# Patient Record
Sex: Male | Born: 1985
Health system: Southern US, Community
[De-identification: ages and names within clinical notes are randomized; demographics above are authoritative.]

## PROBLEM LIST (undated history)

## (undated) DIAGNOSIS — F988 Other specified behavioral and emotional disorders with onset usually occurring in childhood and adolescence: Secondary | ICD-10-CM

## (undated) DIAGNOSIS — T7840XA Allergy, unspecified, initial encounter: Secondary | ICD-10-CM

## (undated) DIAGNOSIS — K219 Gastro-esophageal reflux disease without esophagitis: Secondary | ICD-10-CM

## (undated) HISTORY — PX: HERNIA REPAIR: SHX51

## (undated) HISTORY — PX: UPPER GASTROINTESTINAL ENDOSCOPY: SHX188

## (undated) HISTORY — DX: Gastro-esophageal reflux disease without esophagitis: K21.9

## (undated) HISTORY — DX: Allergy, unspecified, initial encounter: T78.40XA

---
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
## 2012-04-18 ENCOUNTER — Encounter: Payer: Self-pay | Admitting: Internal Medicine

## 2012-04-18 ENCOUNTER — Ambulatory Visit (INDEPENDENT_AMBULATORY_CARE_PROVIDER_SITE_OTHER): Payer: BC Managed Care – HMO | Admitting: Internal Medicine

## 2012-04-18 VITALS — BP 118/60 | HR 64 | Temp 98.2°F | Resp 16 | Ht 74.0 in | Wt 211.0 lb

## 2012-04-18 DIAGNOSIS — G4700 Insomnia, unspecified: Secondary | ICD-10-CM

## 2012-04-18 DIAGNOSIS — R5381 Other malaise: Secondary | ICD-10-CM

## 2012-04-18 DIAGNOSIS — R5383 Other fatigue: Secondary | ICD-10-CM

## 2012-04-18 DIAGNOSIS — Z23 Encounter for immunization: Secondary | ICD-10-CM

## 2012-04-18 DIAGNOSIS — B353 Tinea pedis: Secondary | ICD-10-CM

## 2012-04-18 DIAGNOSIS — F988 Other specified behavioral and emotional disorders with onset usually occurring in childhood and adolescence: Secondary | ICD-10-CM

## 2012-04-18 MED ORDER — CLONAZEPAM 0.5 MG PO TABS: 0.5000 mg | ORAL_TABLET | Freq: Every evening | ORAL | Status: DC | PRN

## 2012-04-18 MED ORDER — CLOTRIMAZOLE-BETAMETHASONE 1-0.05 % EX CREA: TOPICAL_CREAM | Freq: Two times a day (BID) | CUTANEOUS | Status: DC

## 2012-04-19 ENCOUNTER — Encounter: Payer: Self-pay | Admitting: Internal Medicine

## 2012-04-19 DIAGNOSIS — F988 Other specified behavioral and emotional disorders with onset usually occurring in childhood and adolescence: Secondary | ICD-10-CM | POA: Insufficient documentation

## 2012-04-19 LAB — CBC WITH DIFFERENTIAL/PLATELET
Basophils Absolute: 0 10*3/uL (ref 0.0–0.1)
Basophils Relative: 1 % (ref 0–1)
Eosinophils Absolute: 0.2 10*3/uL (ref 0.0–0.7)
Eosinophils Relative: 5 % (ref 0–5)
HCT: 41.3 % (ref 39.0–52.0)
Hemoglobin: 15.2 g/dL (ref 13.0–17.0)
Lymphocytes Relative: 34 % (ref 12–46)
Lymphs Abs: 1.6 10*3/uL (ref 0.7–4.0)
MCH: 31.7 pg (ref 26.0–34.0)
MCHC: 34.6 g/dL (ref 30.0–36.0)
MCV: 87.1 fL (ref 78.0–100.0)
Monocytes Absolute: 0.5 10*3/uL (ref 0.1–1.0)
Monocytes Relative: 11 % (ref 3–12)
Neutro Abs: 2.3 10*3/uL (ref 1.7–7.7)
Neutrophils Relative %: 49 % (ref 43–77)
Platelets: 150 10*3/uL (ref 150–400)
RBC: 4.79 MIL/uL (ref 4.22–5.81)
RDW: 12.8 % (ref 11.5–15.5)
WBC: 4.7 10*3/uL (ref 4.0–10.5)

## 2012-04-19 LAB — COMPREHENSIVE METABOLIC PANEL
ALT: 24 U/L (ref 0–53)
AST: 16 U/L (ref 0–37)
Albumin: 4.6 g/dL (ref 3.5–5.2)
Alkaline Phosphatase: 57 U/L (ref 39–117)
BUN: 16 mg/dL (ref 6–23)
CO2: 30 mEq/L (ref 19–32)
Calcium: 9.5 mg/dL (ref 8.4–10.5)
Chloride: 107 mEq/L (ref 96–112)
Creat: 1.1 mg/dL (ref 0.50–1.35)
Glucose, Bld: 86 mg/dL (ref 70–99)
Potassium: 4.4 mEq/L (ref 3.5–5.3)
Sodium: 143 mEq/L (ref 135–145)
Total Bilirubin: 0.7 mg/dL (ref 0.3–1.2)
Total Protein: 6.6 g/dL (ref 6.0–8.3)

## 2012-04-19 LAB — TSH: TSH: 1.387 u[IU]/mL (ref 0.350–4.500)

## 2012-04-19 LAB — T4, FREE: Free T4: 1.38 ng/dL (ref 0.80–1.80)

## 2012-04-19 NOTE — Progress Notes (Signed)
  Subjective:    Patient ID: Jeremy Moran, male    DOB: 1985-08-06, 26 y.o.   MRN: [id number]  HPIHe has several concerns He is tired often and sometimes sleepy at work over the past 3 months He only sleeps 4-5 hours a night due to his work schedule, and will wake several times/No history of apnea but does have history of lots of movement during sleep.  Sometimes not restored in morning. There are times when he doesn't tuning into his computer monitor briefly,? Falling asleep. No problems driving. No problems with media. 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 He is interested in continuing his hepatitis B vaccine because of this. Of interest, his brother has ADD and narcolepsy. His sister has ADD. His ADD symptoms do not interfere with his work at this point as he is mild. He is not interested in restarting medication but would certainly like more energy during the daytime. He is also concerned about recent weight gain of 10 pounds and acknowledges that he is no longer exercising as he once did.His diet is very good    Review of Systems No fever chills or night sweats/no easy fatigability Hair is somewhat coarse/he is often cold at work He feels more lethargic recently No skin changes No history of thyroid problems in the family No dyspnea on exertion or wheezing No palpitation or chest pains No reflux or dyspepsia and no change in appetite or bowel movements Negative genitourinary No headaches No depression anxiety or suicidal ideation/he has history of a brother who died from an overdose He is definitely more stressed recently due to to the death of his mom's best friend last week/it bothers him to keep losing people. Also notes recent recurrence of his significant tinea pedis which is a chronic problem    Objective:   Physical Exam Filed Vitals:   04/18/12 1547  BP: 118/60  Pulse: 64  Temp: 98.2 F (36.8 C)  Resp: 16   Weight  211 pounds, up from 202 pounds HEENT clear No thyromegaly Heart regular without murmur Extremities clear Except for tinea changes of the foot webspaces iand soles of both feet Neurological intact Psychiatric stable       Assessment & Plan:  Problem #1 fatigue Problem #2 sleep disruption-consideration needs to be given to whether he also has narcolepsy and his problems don't respond to early intervention then sleep study will be obtained/There is some concern for restless leg syndrome Problem #3 ADD-no medications needed Problem #4 recent weight gain-obviously needs to reestablish exercise habits Problem #5 need for hepatitis B series Problem #6 tinea pedis  Plan 1 hepatitis B #2 given today-Followup in 3 months to 12 months for next hep B 2 exercise 3 Klonopin at bedtime 4 Lotrisone twice a day for one month for feet 5 Thyroid and metabolic profile/CBC

## 2012-04-24 ENCOUNTER — Telehealth: Payer: Self-pay

## 2012-04-24 NOTE — Telephone Encounter (Signed)
There are 2 rx's at the [facility]. LMOM for pt to CB and let us know what pharm he uses so we can fax them in.

## 2012-04-25 NOTE — Telephone Encounter (Signed)
[facility]ed [facility] [address]. Faxed RX's

## 2012-07-05 ENCOUNTER — Telehealth: Payer: Self-pay

## 2012-07-05 MED ORDER — CLOTRIMAZOLE-BETAMETHASONE 1-0.05 % EX CREA: TOPICAL_CREAM | Freq: Two times a day (BID) | CUTANEOUS | Status: DC

## 2012-07-05 NOTE — Telephone Encounter (Signed)
Sent this in for him, called him to advise.

## 2012-07-05 NOTE — Telephone Encounter (Signed)
Pt said (and chart noted) that a foot cream for athletes foot was [facility] [address] back in October but they do not have it.  [phone]

## 2012-07-10 ENCOUNTER — Other Ambulatory Visit: Payer: Self-pay | Admitting: Physician Assistant

## 2012-07-10 MED ORDER — PODOFILOX 0.5 % EX SOLN: Freq: Two times a day (BID) | CUTANEOUS | Status: DC

## 2012-07-10 NOTE — Telephone Encounter (Signed)
Would like a refill on his Condylox.  The warts are returning on his hands.

## 2012-08-22 ENCOUNTER — Encounter: Payer: Self-pay | Admitting: Internal Medicine

## 2012-08-22 ENCOUNTER — Ambulatory Visit (INDEPENDENT_AMBULATORY_CARE_PROVIDER_SITE_OTHER): Payer: BC Managed Care – HMO | Admitting: Internal Medicine

## 2012-08-22 VITALS — BP 104/60 | HR 65 | Temp 98.1°F | Resp 16 | Ht 74.0 in | Wt 214.0 lb

## 2012-08-22 DIAGNOSIS — G479 Sleep disorder, unspecified: Secondary | ICD-10-CM

## 2012-08-22 DIAGNOSIS — H6120 Impacted cerumen, unspecified ear: Secondary | ICD-10-CM

## 2012-08-22 DIAGNOSIS — Z23 Encounter for immunization: Secondary | ICD-10-CM

## 2012-08-22 DIAGNOSIS — F988 Other specified behavioral and emotional disorders with onset usually occurring in childhood and adolescence: Secondary | ICD-10-CM

## 2012-08-22 NOTE — Addendum Note (Signed)
Addended by: Johnnette Litter on: 08/22/2012 03:05 PM   Modules accepted: Orders

## 2012-08-22 NOTE — Progress Notes (Signed)
  Subjective:    Patient ID: Jeremy Moran, male    DOB: 11-14-1985, 27 y.o.   MRN: [id number]  HPI here for followup Continues to do well at work although has long hours leaving little time for exercise and little energy for exercise Does well without ADD medication although he would like to use this for a boost in the day so he can get more done we discussed how this is not appropriate  Continues his long-standing problem of sleeping poorly/wakes frequently/occasional afternoon somnolence/no sleep disturbances from breathing or snoring/family history of narcolepsy/not a major problem at this point  Time for hepatitis B #3  For the last few months he is hard time clearing his ears at altitude/? Muffled hearing on occasion  Not currently asking for any medication refills   Review of Systems No new symptoms     Objective:   Physical Exam Vital signs stable Canals occluded by wax left worse than right Irrigation and bilaterally successful and posterior occasion canals are clear without signs of disease/TMs intact Rest of the ENT clear       Assessment & Plan:  Problem # 1 sleep disturbance chronic-discussed valerian/melatonin  Problem #2 cerumen impaction-resolved  Problem #3 immunization update  Problem #4 attention deficit disorder stable without medication  If he has problems with daytime somnolence and nighttime frequent waking increase it would be prudent to do a sleep study potentially including sleep latency testing due to his brother significant unclassified sleeping disorder which mostly resembles narcolepsy  Plan followup as needed/okay to refill Klonopin by phone if needed-he rarely if ever uses this

## 2012-09-26 ENCOUNTER — Telehealth: Payer: Self-pay

## 2012-09-26 NOTE — Telephone Encounter (Signed)
Dr Merla Riches,  Patient wanted to speak with you regarding a prescription change.  [phone]

## 2012-09-26 NOTE — Telephone Encounter (Signed)
Doxycycline for his UTI, states he has had symptoms since and would like different antibiotic for this, [facility] [place] he c/o burning with urination

## 2012-09-27 ENCOUNTER — Emergency Department
Admission: EM | Admit: 2012-09-27 | Discharge: 2012-09-27 | Disposition: A | Discharge status: Home / Self Care | Payer: BC Managed Care – PPO | Source: Home / Self Care | Attending: Family Medicine | Admitting: Family Medicine

## 2012-09-27 ENCOUNTER — Encounter: Payer: Self-pay | Admitting: *Deleted

## 2012-09-27 DIAGNOSIS — N342 Other urethritis: Secondary | ICD-10-CM

## 2012-09-27 DIAGNOSIS — R300 Dysuria: Secondary | ICD-10-CM

## 2012-09-27 HISTORY — DX: Other specified behavioral and emotional disorders with onset usually occurring in childhood and adolescence: F98.8

## 2012-09-27 LAB — POCT URINALYSIS DIP (MANUAL ENTRY)
Bilirubin, UA: NEGATIVE
Blood, UA: NEGATIVE
Glucose, UA: NEGATIVE
Ketones, POC UA: NEGATIVE
Leukocytes, UA: NEGATIVE
Nitrite, UA: NEGATIVE
Protein Ur, POC: NEGATIVE
Spec Grav, UA: 1.025 (ref 1.005–1.03)
Urobilinogen, UA: 1 (ref 0–1)
pH, UA: 6.5 (ref 5–8)

## 2012-09-27 MED ORDER — DOXYCYCLINE HYCLATE 100 MG PO CAPS: 100.0000 mg | ORAL_CAPSULE | Freq: Two times a day (BID) | ORAL | Status: DC

## 2012-09-27 MED ORDER — CEFTRIAXONE SODIUM 250 MG IJ SOLR
250.0000 mg | Freq: Once | INTRAMUSCULAR | Status: AC
  Administered 2012-09-27: 250 mg via INTRAMUSCULAR

## 2012-09-27 NOTE — Telephone Encounter (Signed)
We have not seen him in Epic for a UTI. He needs to come in for evaluation before we can call in a medication for him.

## 2012-09-27 NOTE — ED Provider Notes (Signed)
History     CSN: [id number]  Arrival date & time 09/27/12  1713   First MD Initiated Contact with Patient 09/27/12 1749      Chief Complaint  Patient presents with  . Penile Discharge  . Dysuria       HPI Comments: Patient complains of one week history of mild intermittent urethral discharge and dysuria.  No rash.  No abdominal pain.  He states that he has had GC in the past and present symptoms are similar.  Patient is a 27 y.o. male presenting with dysuria. The history is provided by the patient.  Dysuria  This is a new problem. Episode onset: 1 week ago. The problem occurs intermittently. The problem has not changed since onset.The quality of the pain is described as burning. The pain is mild. There has been no fever. He is sexually active. Associated symptoms include discharge and frequency. Pertinent negatives include no chills, no sweats, no nausea, no vomiting, no hematuria, no hesitancy, no urgency and no flank pain. He has tried nothing for the symptoms.    Past Medical History  Diagnosis Date  . Anxiety   . ADD (attention deficit disorder)     History reviewed. No pertinent past surgical history.  Family History  Problem Relation Age of Onset  . Cancer Father     pancreatic    History  Substance Use Topics  . Smoking status: Former Games developer  . Smokeless tobacco: Not on file  . Alcohol Use: Yes      Review of Systems  Constitutional: Negative for chills.  Gastrointestinal: Negative for nausea and vomiting.  Genitourinary: Positive for dysuria and frequency. Negative for hesitancy, urgency, hematuria and flank pain.  All other systems reviewed and are negative.    Allergies  Review of patient's allergies indicates no known allergies.  Home Medications   Current Outpatient Rx  Name  Route  Sig  Dispense  Refill  . clonazePAM (KLONOPIN) 0.5 MG tablet   Oral   Take 1 tablet (0.5 mg total) by mouth at bedtime as needed.   30 tablet   1   .  doxycycline (VIBRAMYCIN) 100 MG capsule   Oral   Take 1 capsule (100 mg total) by mouth 2 (two) times daily.   14 capsule   0     BP 125/86  Pulse 84  Temp(Src) 98.1 F (36.7 C) (Oral)  Ht 6\' 3"  (1.905 m)  Wt 215 lb (97.523 kg)  BMI 26.87 kg/m2  SpO2 99%  Physical Exam Nursing notes and Vital Signs reviewed. Appearance:  Patient appears healthy, stated age, and in no acute distress Eyes:  Pupils are equal, round, and reactive to light and accomodation.  Extraocular movement is intact.  Conjunctivae are not inflamed  Pharynx:  Normal Neck:  Supple.  No adenopathy Lungs:  Clear to auscultation.  Breath sounds are equal.  Heart:  Regular rate and rhythm without murmurs, rubs, or gallops.  Abdomen:  Nontender without masses or hepatosplenomegaly.  Bowel sounds are present.  No CVA or flank tenderness.  Skin:  No rash present.  Genitourinary:  Penis normal without lesions or urethral discharge.  Scrotum is normal.  Testes are descended bilaterally without nodules or tenderness.  No hernias are palpated.  No regional lymphadenopathy palpated   ED Course  Procedures  none  Labs Reviewed  URINE CULTURE pending  GC/CHLAMYDIA PROBE AMP, URINE pending  POCT URINALYSIS DIP (MANUAL ENTRY) negative      1. Dysuria, with a  past history of GC   2. Urethritis       MDM  GC/chlamydia pending, urine culture. Recephin 250mg  IM; doxycycline 100mg  bid for one week Followup with Family Doctor if not improved in one week.         Lattie Haw, MD 10/01/12 [phone]

## 2012-09-27 NOTE — ED Notes (Signed)
Pt c/o dysuria and d/c from his penis x 1wk.

## 2012-09-28 LAB — GC/CHLAMYDIA PROBE AMP, URINE
Chlamydia, Swab/Urine, PCR: NEGATIVE
GC Probe Amp, Urine: NEGATIVE

## 2012-09-29 LAB — URINE CULTURE
Colony Count: NO GROWTH
Organism ID, Bacteria: NO GROWTH

## 2012-09-29 NOTE — Telephone Encounter (Signed)
Called him advised office visit needed.

## 2012-10-02 ENCOUNTER — Telehealth: Payer: Self-pay | Admitting: *Deleted

## 2013-01-21 ENCOUNTER — Ambulatory Visit (INDEPENDENT_AMBULATORY_CARE_PROVIDER_SITE_OTHER): Payer: BC Managed Care – PPO | Admitting: Cardiovascular Disease

## 2013-01-21 ENCOUNTER — Encounter: Payer: Self-pay | Admitting: Cardiovascular Disease

## 2013-01-21 VITALS — BP 118/72 | HR 58 | Ht 75.0 in | Wt 217.4 lb

## 2013-01-21 DIAGNOSIS — R55 Syncope and collapse: Secondary | ICD-10-CM

## 2013-01-21 NOTE — Assessment & Plan Note (Signed)
Jeremy Moran presents for further evaluation of several episodes of syncope/presyncope that occurred with exertion. He had these when he was a younger child after swimming. More recently, he's had them after 5 K road race  and after a 20 mile bike ride.  He's been able to run short distances without any difficulty but very strenuous activity causes him to be very lightheaded and nauseated.  We'll schedule him for a resting echocardiogram for further evaluation of his cardiac size and valvular function. We'll also schedule him for a stress echocardiogram to evaluate his exercise capacity.  I'll see him back on an as-needed basis.

## 2013-01-21 NOTE — Patient Instructions (Signed)
Your physician has requested that you have an echocardiogram. Echocardiography is a painless test that uses sound waves to create images of your heart. It provides your doctor with information about the size and shape of your heart and how well your heart's chambers and valves are working. This procedure takes approximately one hour. There are no restrictions for this procedure.  Your physician has requested that you have a stress echocardiogram.  Please follow instruction sheet as given.  Your physician recommends that you schedule a follow-up appointment in: as needed

## 2013-01-21 NOTE — Progress Notes (Signed)
     Jeremy Moran Date of Birth  September 13, 1985       [facility] Office [address]  [address], [address] [place], [place]  [address]   [place], [place]  [address] [phone]     [phone]   Fax  [phone]    Fax [phone]  Problem List: 1. Syncope /pre-syncope - after exercise 2. ADD 3.   History of Present Illness:  Jeremy Moran is a 58 with a hx of dizziness after exercising.  This has occurred for years - originally after swim meets.  He has also had an after a runway scanning and after a bike race.  These episodes consist of profound dizziness and hyperventilation.  He turns somewhat pale.  Otherwise he's been in good health. 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  He also ran track in high school. He's never run very far distances but is able to run 1- 2 miles  at a moderate pace without difficulty.  He's also been having some episodes of chest pain recently. He was evaluated in the emergency room several weeks ago for these episodes of chest pain. He's been placed on a proton pump inhibitor for the time being. He is scheduled for upper endoscopy in several weeks.  Current Outpatient Prescriptions on File Prior to Visit  Medication Sig Dispense Refill  . clonazePAM (KLONOPIN) 0.5 MG tablet Take 1 tablet (0.5 mg total) by mouth at bedtime as needed.  30 tablet  1   No current facility-administered medications on file prior to visit.    No Known Allergies  Past Medical History  Diagnosis Date  . Anxiety   . ADD (attention deficit disorder)     No past surgical history on file.  History  Smoking status  . Former Smoker  Smokeless tobacco  . Not on file    History  Alcohol Use  . Yes    Family History  Problem Relation Age of Onset  . Cancer Father     pancreatic    Reviw of Systems:  Reviewed in the HPI.  All other systems are negative.  Physical  Exam: Blood pressure 118/72, pulse 58, height 6\' 3"  (1.905 m), weight 217 lb 6.4 oz (98.612 kg). General: Well developed, well nourished, in no acute distress.  Head: Normocephalic, atraumatic, sclera non-icteric, mucus membranes are moist,   Neck: Supple. Carotids are 2 + without bruits. No JVD   Lungs: Clear   Heart: RR, normal S1, S2. No murmurs  Abdomen: Soft, non-tender, non-distended with normal bowel sounds.  Msk:  Strength and tone are normal   Extremities: No clubbing or cyanosis. No edema.  Distal pedal pulses are 2+ and equal    Neuro: CN II - XII intact.  Alert and oriented X 3.   Psych:  Normal   ECG: January 21, 2013:  Sinus brady at 69.  No ST or T wave changes  Assessment / Plan:

## 2013-01-30 ENCOUNTER — Ambulatory Visit (HOSPITAL_BASED_OUTPATIENT_CLINIC_OR_DEPARTMENT_OTHER): Payer: BC Managed Care – PPO

## 2013-01-30 ENCOUNTER — Encounter: Payer: Self-pay | Admitting: Cardiovascular Disease

## 2013-01-30 ENCOUNTER — Ambulatory Visit (HOSPITAL_COMMUNITY): Payer: BC Managed Care – PPO | Attending: Cardiology

## 2013-01-30 DIAGNOSIS — R55 Syncope and collapse: Secondary | ICD-10-CM | POA: Insufficient documentation

## 2013-01-30 DIAGNOSIS — R0989 Other specified symptoms and signs involving the circulatory and respiratory systems: Secondary | ICD-10-CM

## 2013-01-30 DIAGNOSIS — Z87891 Personal history of nicotine dependence: Secondary | ICD-10-CM | POA: Insufficient documentation

## 2013-01-30 DIAGNOSIS — R42 Dizziness and giddiness: Secondary | ICD-10-CM | POA: Insufficient documentation

## 2013-01-30 NOTE — Progress Notes (Signed)
Echocardiogram performed.  

## 2013-01-30 NOTE — Progress Notes (Signed)
Echocardiogram stress performed.  

## 2013-02-26 ENCOUNTER — Encounter: Payer: Self-pay | Admitting: Cardiovascular Disease

## 2014-10-29 ENCOUNTER — Ambulatory Visit: Payer: Self-pay | Admitting: Internal Medicine

## 2014-11-26 ENCOUNTER — Encounter: Payer: Self-pay | Admitting: Internal Medicine

## 2014-11-26 ENCOUNTER — Ambulatory Visit (INDEPENDENT_AMBULATORY_CARE_PROVIDER_SITE_OTHER): Payer: 59 | Admitting: Internal Medicine

## 2014-11-26 VITALS — BP 111/72 | HR 71 | Temp 98.3°F | Resp 16 | Ht 74.0 in | Wt 204.0 lb

## 2014-11-26 DIAGNOSIS — Z209 Contact with and (suspected) exposure to unspecified communicable disease: Secondary | ICD-10-CM

## 2014-11-26 DIAGNOSIS — F909 Attention-deficit hyperactivity disorder, unspecified type: Secondary | ICD-10-CM

## 2014-11-26 DIAGNOSIS — F988 Other specified behavioral and emotional disorders with onset usually occurring in childhood and adolescence: Secondary | ICD-10-CM

## 2014-11-26 DIAGNOSIS — L989 Disorder of the skin and subcutaneous tissue, unspecified: Secondary | ICD-10-CM

## 2014-11-26 DIAGNOSIS — Z23 Encounter for immunization: Secondary | ICD-10-CM | POA: Diagnosis not present

## 2014-11-26 MED ORDER — DICLOFENAC SODIUM 75 MG PO TBEC: 75.0000 mg | DELAYED_RELEASE_TABLET | Freq: Two times a day (BID) | ORAL | Status: DC

## 2014-11-26 NOTE — Progress Notes (Deleted)
   Subjective:    Patient ID: Jeremy Moran, male    DOB: [birth year], 29 y.o.   MRN: [id number]  HPI    Review of Systems     Objective:   Physical Exam        Assessment & Plan:

## 2014-11-26 NOTE — Progress Notes (Signed)
   Subjective:    Patient ID: Jeremy Moran, male    DOB: February 01, 1986, 29 y.o.   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 Syncope 01/21/2013  . Sleep disturbance 08/22/2012  . Attention deficit disorder 04/19/2012  these problems all resolved/Psy-soc stable now  New prob knee pain--bilat anterior-after sitting or lying awhile--past 39mo Plays BB without pain Never swells  Also skin lesions 1-R hand few weeks-chg color-?spreading/itch at first 2- pen shaft 1-2 mos pigmented --flakey after IC  Review of Systems All others neg    Objective:   Physical Exam BP 111/72 mmHg  Pulse 71  Temp(Src) 98.3 F (36.8 C)  Resp 16  Ht 6\' 2"  (1.88 m)  Wt 204 lb (92.534 kg)  BMI 26.18 kg/m2  SpO2 99% Tender infrapat pole bilat w/ sl crep no swell/red.. Full rom otherwise Hand lesions < gran annulare or lichen plan Penile lesion dorsal shaft =single, brown ? neveu vs pigmented HPV       Assessment & Plan:  Exposure to communicable disease - Plan: Measles/Mumps/Rubella Immunity titer--has hx disease  Need for tuberculosis vaccination - Plan: TB Skin Test  Need for diphtheria-tetanus-pertussis (Tdap) vaccine, adult/adolescent  Skin lesion of hand---to derm/may need bx for dx  Skin lesion-penis--to derm for diagnosis  Jumpers Knee-bilat -exercises/stretches -melox -ice p ex

## 2014-11-26 NOTE — Progress Notes (Signed)

## 2014-11-27 LAB — MEASLES/MUMPS/RUBELLA IMMUNITY
Mumps IgG: 18.2 AU/mL — ABNORMAL HIGH (ref <9.00)
Rubella: 3.35 Index — ABNORMAL HIGH (ref <0.90)
Rubeola IgG: 59.5 AU/mL — ABNORMAL HIGH (ref <25.00)

## 2014-11-29 ENCOUNTER — Ambulatory Visit (INDEPENDENT_AMBULATORY_CARE_PROVIDER_SITE_OTHER): Payer: 59 | Admitting: *Deleted

## 2014-11-29 DIAGNOSIS — Z7689 Persons encountering health services in other specified circumstances: Secondary | ICD-10-CM

## 2014-11-29 DIAGNOSIS — Z111 Encounter for screening for respiratory tuberculosis: Secondary | ICD-10-CM

## 2014-11-29 LAB — TB SKIN TEST
Induration: 0 mm
TB Skin Test: NEGATIVE

## 2014-11-29 NOTE — Addendum Note (Signed)
Addended by: Constance Goltz on: 11/29/2014 03:31 PM   Modules accepted: Orders, SmartSet

## 2014-11-29 NOTE — Progress Notes (Signed)
   Subjective:    Patient ID: Jeremy Moran, male    DOB: 1986-07-24, 29 y.o.   MRN: [id number]  HPI  Patient here for ppd reading 23mm induration, negative reading.     Review of Systems     Objective:   Physical Exam        Assessment & Plan:

## 2014-12-01 LAB — VARICELLA ZOSTER ANTIBODY, IGG: Varicella IgG: 1135 Index — ABNORMAL HIGH (ref <135.00)

## 2015-06-22 ENCOUNTER — Encounter: Payer: Self-pay | Admitting: Internal Medicine

## 2016-02-11 ENCOUNTER — Encounter: Payer: Self-pay | Admitting: Physician Assistant

## 2016-06-30 ENCOUNTER — Ambulatory Visit (INDEPENDENT_AMBULATORY_CARE_PROVIDER_SITE_OTHER): Payer: 59 | Admitting: Urgent Care

## 2016-06-30 ENCOUNTER — Ambulatory Visit (INDEPENDENT_AMBULATORY_CARE_PROVIDER_SITE_OTHER): Payer: 59

## 2016-06-30 VITALS — BP 132/76 | HR 88 | Temp 97.4°F | Resp 16 | Ht 74.0 in | Wt 214.4 lb

## 2016-06-30 DIAGNOSIS — N50819 Testicular pain, unspecified: Secondary | ICD-10-CM | POA: Diagnosis not present

## 2016-06-30 DIAGNOSIS — Z0000 Encounter for general adult medical examination without abnormal findings: Secondary | ICD-10-CM

## 2016-06-30 LAB — POCT URINALYSIS DIP (MANUAL ENTRY)
Bilirubin, UA: NEGATIVE
Blood, UA: NEGATIVE
Glucose, UA: NEGATIVE
Ketones, POC UA: NEGATIVE
Leukocytes, UA: NEGATIVE
Nitrite, UA: NEGATIVE
Protein Ur, POC: NEGATIVE
Spec Grav, UA: 1.025
Urobilinogen, UA: 0.2
pH, UA: 5.5

## 2016-06-30 LAB — POC MICROSCOPIC URINALYSIS (UMFC): Mucus: ABSENT

## 2016-06-30 NOTE — Patient Instructions (Addendum)
Testicular pain Testicle pain has a number of possible causes. The testicles are very sensitive, and even a minor injury can cause testicle pain or discomfort. Testicle pain might arise from within the testicle itself or from the coiled tube and supporting tissue behind the testicle (epididymis).  Sometimes, what seems to be testicle pain is caused by a problem that starts in the groin, abdomen or somewhere else - for example, kidney stones and some hernias can cause testicle pain. The cause of testicle pain can't always be identified.  Causes of testicle pain or pain in the testicle area can include:  (nerve damage caused by diabetes) Drug side effect, such as certain antibiotics and chemotherapy drugs Epididymitis (testicle inflammation) Gangrene (specifically, a type of gangrene called Fournier's gangrene) Henoch-Schonlein purpura (blood vessel inflammation) Hydrocele (fluid buildup that causes swelling of the scrotum) Idiopathic testicular pain (unknown cause) Inguinal hernia Kidney stones (inflamed testicle) Prostatitis (infection or inflammation of the prostate) Scrotal masses (fluid buildup in the testicle) Testicle injury or blow to the testicles Testicular cancer (twisted testicle) Undescended testicle (also called cryptorchidism) Urinary tract infection (UTI) Varicocele (enlarged veins in the scrotum) Vasectomy  When to see a doctor? Sudden, severe testicle pain can be a sign of testicular torsion - a twisted testicle that can quickly lose its blood supply. This condition requires immediate medical treatment to prevent loss of the testicle. Testicular torsion can occur in males of any age, although it is more common in adolescents.  Seek immediate medical attention if you have:  Sudden, severe testicle pain Testicle pain accompanied by nausea, fever, chills or blood in your urine Schedule a doctor's visit if you have:  Mild testicle pain lasting longer than a few  days A lump or swelling in or around a testicle Self-care  These measures might help relieve mild testicle pain:  Take an over-the-counter pain reliever such as aspirin, ibuprofen (Advil, Motrin IB, others) or acetaminophen (Tylenol, others), unless your doctor has given you other instructions. Never give aspirin to your child without talking to a doctor first because aspirin has been linked to Reye's syndrome, a rare but potentially life-threatening condition in children and teens. Support the scrotum with an athletic supporter. Use a folded towel for support and elevation when you're lying down.    IF you received an x-ray today, you will receive an invoice from [facility]. Please contact [facility] at [phone] with questions or concerns regarding your invoice.   IF you received labwork today, you will receive an invoice from [employer]. Please contact [facility] at [phone] with questions or concerns regarding your invoice.   Our billing staff will not be able to assist you with questions regarding bills from these companies.  You will be contacted with the lab results as soon as they are available. The fastest way to get your results is to activate your My Chart account. Instructions are located on the last page of this paperwork. If you have not heard from [place] regarding the results in 2 weeks, please contact this office.
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
## 2016-07-01 LAB — BASIC METABOLIC PANEL
BUN/Creatinine Ratio: 24 — ABNORMAL HIGH (ref 9–20)
BUN: 26 mg/dL — ABNORMAL HIGH (ref 6–20)
CO2: 22 mmol/L (ref 18–29)
Calcium: 9.3 mg/dL (ref 8.7–10.2)
Chloride: 104 mmol/L (ref 96–106)
Creatinine, Ser: 1.09 mg/dL (ref 0.76–1.27)
GFR calc Af Amer: 104 mL/min/{1.73_m2} (ref >59)
GFR calc non Af Amer: 90 mL/min/{1.73_m2} (ref >59)
Glucose: 80 mg/dL (ref 65–99)
Potassium: 4.2 mmol/L (ref 3.5–5.2)
Sodium: 146 mmol/L — ABNORMAL HIGH (ref 134–144)

## 2016-07-01 LAB — CBC WITH DIFFERENTIAL/PLATELET
Basophils Absolute: 0 10*3/uL (ref 0.0–0.2)
Basos: 0 %
EOS (ABSOLUTE): 0.1 10*3/uL (ref 0.0–0.4)
Eos: 2 %
Hematocrit: 40.1 % (ref 37.5–51.0)
Hemoglobin: 14.6 g/dL (ref 13.0–17.7)
Immature Grans (Abs): 0 10*3/uL (ref 0.0–0.1)
Immature Granulocytes: 0 %
Lymphocytes Absolute: 1.7 10*3/uL (ref 0.7–3.1)
Lymphs: 24 %
MCH: 31.9 pg (ref 26.6–33.0)
MCHC: 36.4 g/dL — ABNORMAL HIGH (ref 31.5–35.7)
MCV: 88 fL (ref 79–97)
Monocytes Absolute: 0.7 10*3/uL (ref 0.1–0.9)
Monocytes: 10 %
Neutrophils Absolute: 4.4 10*3/uL (ref 1.4–7.0)
Neutrophils: 64 %
Platelets: 143 10*3/uL — ABNORMAL LOW (ref 150–379)
RBC: 4.57 x10E6/uL (ref 4.14–5.80)
RDW: 13.8 % (ref 12.3–15.4)
WBC: 7 10*3/uL (ref 3.4–10.8)

## 2016-07-01 LAB — HIV ANTIBODY (ROUTINE TESTING W REFLEX): HIV Screen 4th Generation wRfx: NONREACTIVE

## 2016-07-01 LAB — RPR: RPR Ser Ql: NONREACTIVE

## 2016-07-02 LAB — GC/CHLAMYDIA PROBE AMP
Chlamydia trachomatis, NAA: NEGATIVE
Neisseria gonorrhoeae by PCR: NEGATIVE

## 2016-07-07 ENCOUNTER — Telehealth: Payer: Self-pay

## 2016-07-07 ENCOUNTER — Other Ambulatory Visit: Payer: Self-pay | Admitting: Urgent Care

## 2016-07-07 DIAGNOSIS — N50819 Testicular pain, unspecified: Secondary | ICD-10-CM

## 2016-07-07 NOTE — Telephone Encounter (Signed)
error 

## 2016-07-07 NOTE — Addendum Note (Signed)
Addended by: Jefferson Fuel on: 07/07/2016 03:19 PM   Modules accepted: Orders

## 2017-01-19 ENCOUNTER — Ambulatory Visit (INDEPENDENT_AMBULATORY_CARE_PROVIDER_SITE_OTHER): Payer: 59 | Admitting: Physician Assistant

## 2017-01-19 VITALS — BP 113/61 | HR 58 | Temp 98.8°F | Resp 18 | Ht 74.0 in | Wt 215.8 lb

## 2017-01-19 DIAGNOSIS — Z111 Encounter for screening for respiratory tuberculosis: Secondary | ICD-10-CM | POA: Diagnosis not present

## 2017-01-19 DIAGNOSIS — H578 Other specified disorders of eye and adnexa: Secondary | ICD-10-CM | POA: Diagnosis not present

## 2017-01-19 DIAGNOSIS — H5789 Other specified disorders of eye and adnexa: Secondary | ICD-10-CM

## 2017-01-19 NOTE — Progress Notes (Signed)
01/19/2017 5:54 PM   DOB: 04-10-1986 / MRN: [id number]  SUBJECTIVE:  Jeremy Moran is a 31 y.o. male presenting for eye irritation that he describes as dryness.  He needs a referral to an eye doctor because he used to work in Sales promotion account executive and often feels that his eye are chronically irritated somewhat.  He denies changes in vision, photophobia, nausea.   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   He has No Known Allergies.   He  has a past medical history of ADD (attention deficit disorder) and Allergy.    He  reports that he has never smoked. He has never used smokeless tobacco. He reports that he drinks alcohol. He reports that he does not use drugs. He  has no sexual activity history on file. The patient  has a past surgical history that includes Hernia repair.  His family history includes Cancer in his father and paternal grandmother; Heart disease in his maternal grandmother; Hypertension in his maternal grandmother; Stroke in his maternal grandmother.  Review of Systems  Constitutional: Negative for chills and fever.  Eyes: Positive for discharge and redness. Negative for blurred vision, double vision, photophobia and pain.  Cardiovascular: Negative for chest pain.  Gastrointestinal: Negative for nausea.  Skin: Negative for itching and rash.  Neurological: Negative for dizziness.  Psychiatric/Behavioral: Negative for depression.    The problem list and medications were reviewed and updated by myself where necessary and exist elsewhere in the encounter.   OBJECTIVE:  BP 113/61 (BP Location: Right Arm, Patient Position: Sitting, Cuff Size: Normal)   Pulse (!) 58   Temp 98.8 F (37.1 C) (Oral)   Resp 18   Ht 6\' 2"  (1.88 m)   Wt 215 lb 12.8 oz (97.9 kg)   SpO2 100%   BMI 27.71 kg/m   Physical Exam  Constitutional: He is oriented to person, place, and time.  Eyes: EOM are normal. Pupils are equal, round, and reactive to light. Right eye  exhibits no discharge and no exudate. Left eye exhibits no discharge and no exudate. Right conjunctiva is injected. Right conjunctiva has no hemorrhage. Left conjunctiva is not injected. Left conjunctiva has no hemorrhage. Right eye exhibits normal extraocular motion and no nystagmus. Left eye exhibits normal extraocular motion. Right pupil is round and reactive. Left pupil is round and reactive. Pupils are equal.  Fundoscopic exam:      The right eye shows no AV nicking, no exudate and no hemorrhage. The right eye shows red reflex.       The left eye shows no AV nicking, no exudate and no hemorrhage. The left eye shows red reflex.    Cardiovascular: Normal rate.   Pulmonary/Chest: Effort normal.  Musculoskeletal: Normal range of motion.  Neurological: He is alert and oriented to person, place, and time.    No results found for this or any previous visit (from the past 72 hour(s)).  No results found.  ASSESSMENT AND PLAN:  Jeremy Moran was seen today for tb test.  Diagnoses and all orders for this visit:  Eye irritation Comments: Somewhat chronic.  Advised he see optho. Will refer to Dr. Zenia Resides office. He will try systane until seeing opth.   Screening-pulmonary TB -     Quantiferon tb gold assay    The patient is advised to call or return to clinic if he does not see an improvement in symptoms, or to seek the care of the closest emergency department  if he worsens with the above plan.   Philis Fendt, MHS, PA-C [facility] 01/19/2017 5:54 PM

## 2017-01-19 NOTE — Patient Instructions (Signed)
     IF you received an x-ray today, you will receive an invoice from [facility]. Please contact [facility] at [phone] with questions or concerns regarding your invoice.   IF you received labwork today, you will receive an invoice from [facility]. Please contact [facility] at [phone] with questions or concerns regarding your invoice.   Our billing staff will not be able to assist you with questions regarding bills from these companies.  You will be contacted with the lab results as soon as they are available. The fastest way to get your results is to activate your My Chart account. Instructions are located on the last page of this paperwork. If you have not heard from us regarding the results in 2 weeks, please contact this office.     

## 2017-01-23 ENCOUNTER — Telehealth: Payer: Self-pay | Admitting: Family Medicine

## 2017-01-23 NOTE — Telephone Encounter (Signed)
Jeremy Moran pt calling about TB results and he will come by the office to drop off a form for you to fill out along with needing a copy of all his immunizations

## 2017-01-24 LAB — QUANTIFERON IN TUBE
QFT TB AG MINUS NIL VALUE: 0.06 IU/mL
QUANTIFERON MITOGEN VALUE: >10 IU/mL
QUANTIFERON TB AG VALUE: 0.12 IU/mL
QUANTIFERON TB GOLD: NEGATIVE
Quantiferon Nil Value: 0.06 IU/mL

## 2017-01-24 LAB — QUANTIFERON TB GOLD ASSAY (BLOOD)

## 2017-01-24 NOTE — Telephone Encounter (Signed)
LVM for pt to call back, TB lab has not resulted yet.

## 2017-01-26 ENCOUNTER — Other Ambulatory Visit: Payer: Self-pay | Admitting: Physician Assistant

## 2017-01-26 NOTE — Telephone Encounter (Signed)
Called pt. Made aware of result.

## 2017-02-07 ENCOUNTER — Other Ambulatory Visit: Payer: Self-pay | Admitting: Emergency Medicine

## 2017-02-07 ENCOUNTER — Telehealth: Payer: Self-pay | Admitting: Physician Assistant

## 2017-02-07 DIAGNOSIS — H5789 Other specified disorders of eye and adnexa: Secondary | ICD-10-CM

## 2017-02-07 NOTE — Telephone Encounter (Signed)
Order placed and sent to Legrand Como to sign

## 2017-02-07 NOTE — Progress Notes (Unsigned)
Please sign referral order

## 2017-02-07 NOTE — Telephone Encounter (Signed)
Pt left vm for referrals stating he saw Legrand Como a couple of weeks ago and was going to have a referral for ophthalmologist. I did not see referral placed. Please advise. Pt can be reached at [phone]. Thanks!

## 2017-03-22 DIAGNOSIS — H10413 Chronic giant papillary conjunctivitis, bilateral: Secondary | ICD-10-CM | POA: Diagnosis not present

## 2017-04-04 MED FILL — ZYLET EYE DROPS: 0.5-0.3 | 13 days supply | Qty: 5 | Fill #0

## 2017-04-12 DIAGNOSIS — H10413 Chronic giant papillary conjunctivitis, bilateral: Secondary | ICD-10-CM | POA: Diagnosis not present

## 2018-01-01 IMAGING — DX DG ABDOMEN 1V
1 series · 1 of 1 positions shown · non-contrast
Comparison: None.

CLINICAL DATA: Testicular pain.

EXAM:
ABDOMEN - 1 VIEW

[abdomen kub]
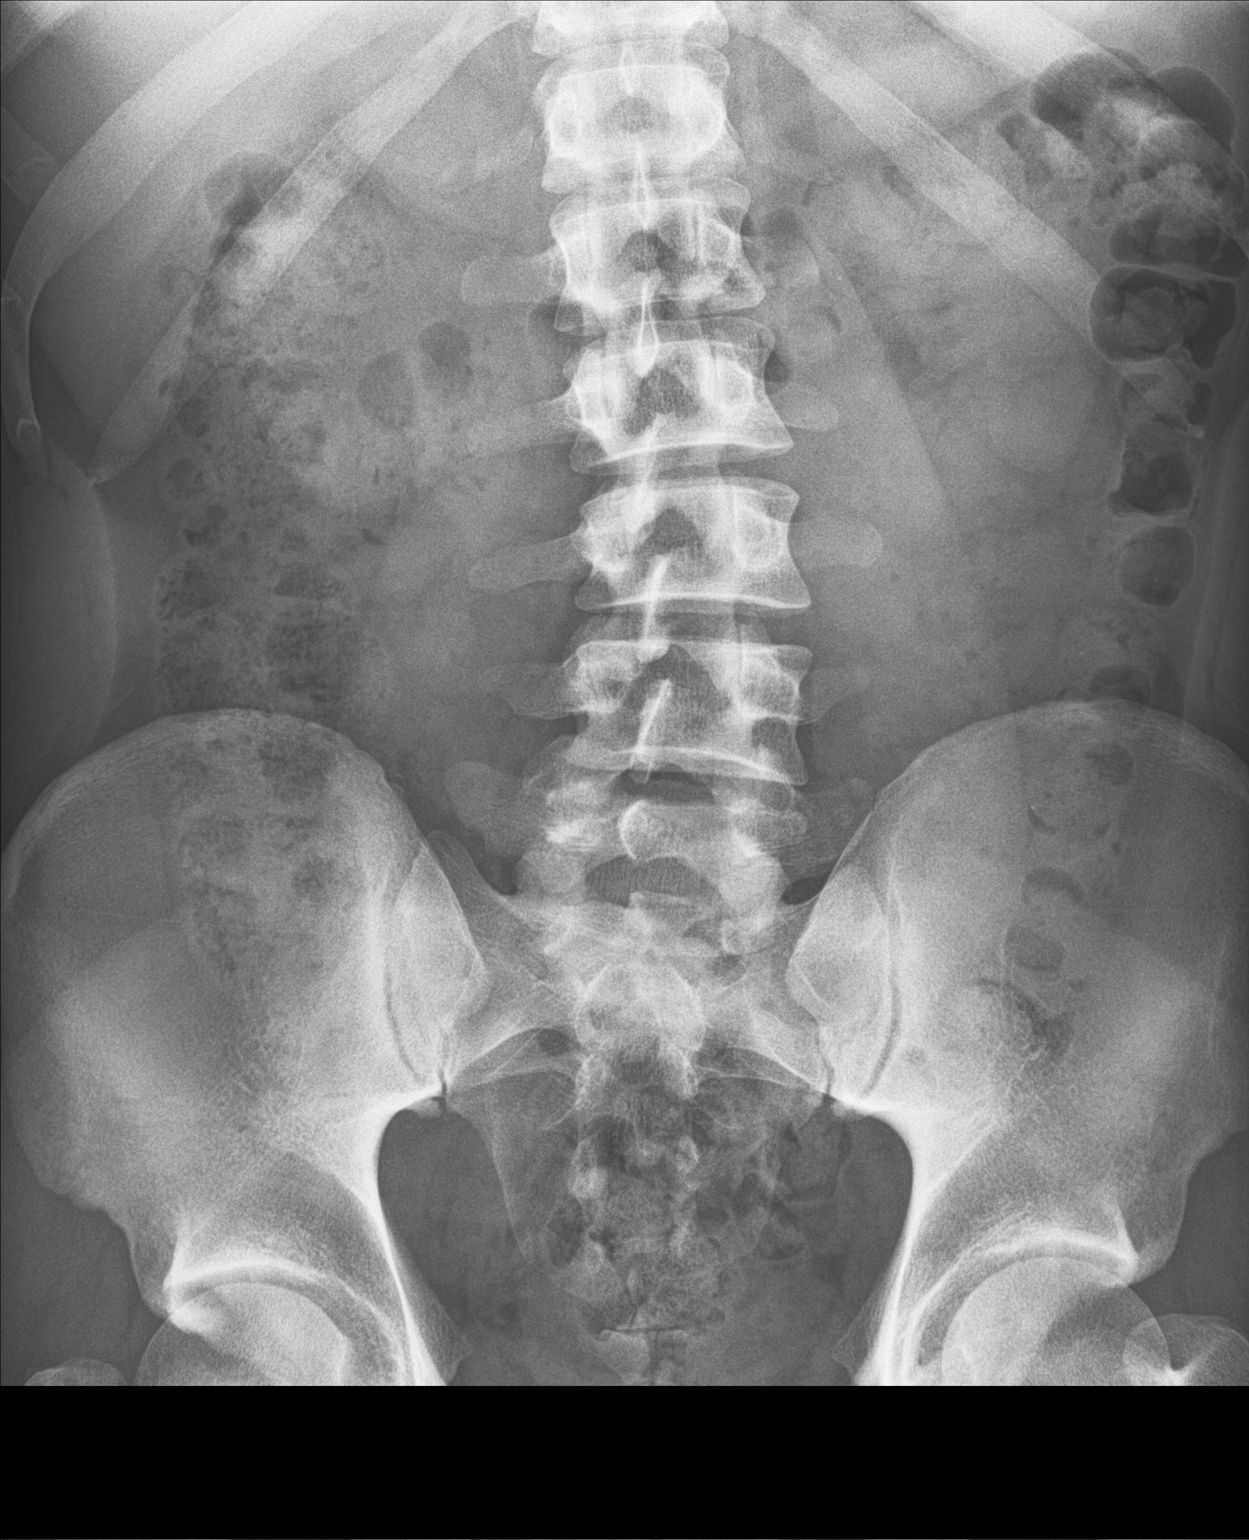

[1 of 1 positions shown; findings below may reference images not displayed]

FINDINGS: The bowel gas pattern is normal. No radio-opaque calculi or other
significant radiographic abnormality are seen.
IMPRESSION: Negative.

## 2018-01-10 ENCOUNTER — Ambulatory Visit: Payer: Self-pay | Admitting: Nurse Practitioner

## 2018-01-10 VITALS — BP 115/65 | HR 60 | Temp 98.7°F | Resp 16 | Wt 214.8 lb

## 2018-01-10 DIAGNOSIS — Z0000 Encounter for general adult medical examination without abnormal findings: Secondary | ICD-10-CM

## 2018-01-10 NOTE — Progress Notes (Signed)
Subjective:  Jeremy Moran is a 32 y.o. male who presents for basic physical exam.  She presents for his health assessment in order to prevent a rise in his health insurance premium as required by [facility] health.  The patient admits to a history of seasonal allergies, reflux disease, and attention deficit disorder as a child.  He is currently not taking any medications for this condition.  Patient denies any history of heart disease, lung disease, kidney disease, diabetes, liver disease, asthma, seizures, hypertension.  Patient denies any current health related concerns today.  The patient's immunizations are up-to-date.  The patient does have a surgical history of hernia repair..  The patient is married, and these expecting his first child in December.  The patient has a family history of diabetes, prostate cancer, breast cancer, and dementia on his father's side.  The patient states his mother's side of the family is healthy.  Both of the patient's parents are alive.  Patient has 2 siblings, one brother, one sister, his brother has a history of kyphosis, and degenerative joint disease.  The patient's sister has no health problems.  The patient denies any use of recreational drugs, smoking, but does drink socially.   Immunization History  Administered Date(s) Administered  . Hepatitis B 04/18/2012, 08/22/2012  . Influenza-Unspecified 05/25/2016  . PPD Test 11/26/2014  . Tdap 11/26/2014    Past Medical History:  Diagnosis Date  . ADD (attention deficit disorder)   . Allergy     Past Surgical History:  Procedure Laterality Date  . HERNIA REPAIR      Social History   Tobacco Use  . Smoking status: Never Smoker  . Smokeless tobacco: Never Used  Substance Use Topics  . Alcohol use: Yes  . Drug use: No    No Known Allergies  Current Outpatient Medications  Medication Sig Dispense Refill  . cetirizine (ZYRTEC) 10 MG tablet Take 10 mg by mouth daily.    Marland Kitchen omeprazole (PRILOSEC) 10 MG  capsule Take 10 mg by mouth daily.     No current facility-administered medications for this visit.     Review of Systems  Constitutional: Negative.   HENT: Negative.   Eyes: Negative.   Respiratory: Negative.   Cardiovascular: Negative.   Gastrointestinal: Negative.   Genitourinary: Negative.   Musculoskeletal: Negative.   Skin: Negative.   Neurological: Negative.   Endo/Heme/Allergies: Positive for environmental allergies.  Psychiatric/Behavioral: Negative.     Objective:  BP 115/65 (BP Location: Right Arm, Patient Position: Sitting, Cuff Size: Normal)   Pulse 60   Temp 98.7 F (37.1 C) (Oral)   Resp 16   Wt 214 lb 12.8 oz (97.4 kg)   SpO2 99%   BMI 27.58 kg/m   General Appearance:  Alert, cooperative, no distress, appears stated age  Head:  Normocephalic, without obvious abnormality, atraumatic  Eyes:  PERRL, conjunctiva/corneas clear, EOM's intact, fundi benign, both eyes  Ears:  Normal TM's and external ear canals, both ears  Nose: Nares normal, septum midline, mucosa normal, no drainage or sinus tenderness  Throat: Lips, mucosa, and tongue normal; teeth and gums normal  Neck: Supple, symmetrical, trachea midline, no adenopathy, thyroid: not enlarged, symmetric, no tenderness/mass/nodules, no carotid bruit or JVD  Back:   Symmetric, no curvature, ROM normal, no CVA tenderness  Lungs:   Clear to auscultation bilaterally, respirations unlabored  Chest Wall:  No tenderness or deformity  Heart:  Regular rate and rhythm, S1, S2 normal, no murmur, rub or gallop  Abdomen:  Soft, non-tender, bowel sounds active all four quadrants,  no masses, no organomegaly  Genitalia:  Deferred  Rectal:  Deferred  Extremities: Extremities normal, atraumatic, no cyanosis or edema  Pulses: 2+ and symmetric  Skin: Skin color, texture, turgor normal, no rashes or lesions  Lymph nodes: Cervical, supraclavicular nodes normal  Neurologic: Normal     Assessment:  basic physical exam     Plan:  Patient education provided.  Discussed with patient the need to establish primary care.  The patient was given a referral for a [place] physician, and the number for the patient engagement center.  Patient education was provided on health maintenance and health prevention for his age group.  Patient was instructed to follow-up as needed in order to get lab work, and any other required diagnostic screenings.  The patient verbalizes understanding and has no questions at time of discharge.  Patient will follow-up with PCP once care has been established.

## 2018-01-10 NOTE — Patient Instructions (Signed)
Health Maintenance, Male  [facility]- [phone] OR [phone] Dr. Garret Reddish- [facility] [place]- [phone]  A healthy lifestyle and preventive care is important for your health and wellness. Ask your health care provider about what schedule of regular examinations is right for you. What should I know about weight and diet? Eat a Healthy Diet  Eat plenty of vegetables, fruits, whole grains, low-fat dairy products, and lean protein.  Do not eat a lot of foods high in solid fats, added sugars, or salt.  Maintain a Healthy Weight Regular exercise can help you achieve or maintain a healthy weight. You should:  Do at least 150 minutes of exercise each week. The exercise should increase your heart rate and make you sweat (moderate-intensity exercise).  Do strength-training exercises at least twice a week.  Watch Your Levels of Cholesterol and Blood Lipids  Have your blood tested for lipids and cholesterol every 5 years starting at 33 years of age. If you are at high risk for heart disease, you should start having your blood tested when you are 33 years old. You may need to have your cholesterol levels checked more often if: ? Your lipid or cholesterol levels are high. ? You are older than 33 years of age. ? You are at high risk for heart disease.  What should I know about cancer screening? Many types of cancers can be detected early and may often be prevented. Lung Cancer  You should be screened every year for lung cancer if: ? You are a current smoker who has smoked for at least 30 years. ? You are a former smoker who has quit within the past 15 years.  Talk to your health care provider about your screening options, when you should start screening, and how often you should be screened.  Colorectal Cancer  Routine colorectal cancer screening usually begins at 33 years of age and should be repeated every 5-10 years until you are 33 years old. You may  need to be screened more often if early forms of precancerous polyps or small growths are found. Your health care provider may recommend screening at an earlier age if you have risk factors for colon cancer.  Your health care provider may recommend using home test kits to check for hidden blood in the stool.  A small camera at the end of a tube can be used to examine your colon (sigmoidoscopy or colonoscopy). This checks for the earliest forms of colorectal cancer.  Prostate and Testicular Cancer  Depending on your age and overall health, your health care provider may do certain tests to screen for prostate and testicular cancer.  Talk to your health care provider about any symptoms or concerns you have about testicular or prostate cancer.  Skin Cancer  Check your skin from head to toe regularly.  Tell your health care provider about any new moles or changes in moles, especially if: ? There is a change in a mole's size, shape, or color. ? You have a mole that is larger than a pencil eraser.  Always use sunscreen. Apply sunscreen liberally and repeat throughout the day.  Protect yourself by wearing long sleeves, pants, a wide-brimmed hat, and sunglasses when outside.  What should I know about heart disease, diabetes, and high blood pressure?  If you are 2-67 years of age, have your blood pressure checked every 3-5 years. If you are 71 years of age or older, have your blood pressure checked every year. You should have  your blood pressure measured twice-once when you are at a hospital or clinic, and once when you are not at a hospital or clinic. Record the average of the two measurements. To check your blood pressure when you are not at a hospital or clinic, you can use: ? An automated blood pressure machine at a pharmacy. ? A home blood pressure monitor.  Talk to your health care provider about your target blood pressure.  If you are between 86-26 years old, ask your health care  provider if you should take aspirin to prevent heart disease.  Have regular diabetes screenings by checking your fasting blood sugar level. ? If you are at a normal weight and have a low risk for diabetes, have this test once every three years after the age of 45. ? If you are overweight and have a high risk for diabetes, consider being tested at a younger age or more often.  A one-time screening for abdominal aortic aneurysm (AAA) by ultrasound is recommended for men aged 42-75 years who are current or former smokers. What should I know about preventing infection? Hepatitis B If you have a higher risk for hepatitis B, you should be screened for this virus. Talk with your health care provider to find out if you are at risk for hepatitis B infection. Hepatitis C Blood testing is recommended for:  Everyone born from [birth year] through 1966.  Anyone with known risk factors for hepatitis C.  Sexually Transmitted Diseases (STDs)  You should be screened each year for STDs including gonorrhea and chlamydia if: ? You are sexually active and are younger than 33 years of age. ? You are older than 33 years of age and your health care provider tells you that you are at risk for this type of infection. ? Your sexual activity has changed since you were last screened and you are at an increased risk for chlamydia or gonorrhea. Ask your health care provider if you are at risk.  Talk with your health care provider about whether you are at high risk of being infected with HIV. Your health care provider may recommend a prescription medicine to help prevent HIV infection.  What else can I do?  Schedule regular health, dental, and eye exams.  Stay current with your vaccines (immunizations).  Do not use any tobacco products, such as cigarettes, chewing tobacco, and e-cigarettes. If you need help quitting, ask your health care provider.  Limit alcohol intake to no more than 2 drinks per day. One drink equals 12  ounces of beer, 5 ounces of wine, or 1 ounces of hard liquor.  Do not use street drugs.  Do not share needles.  Ask your health care provider for help if you need support or information about quitting drugs.  Tell your health care provider if you often feel depressed.  Tell your health care provider if you have ever been abused or do not feel safe at home. This information is not intended to replace advice given to you by your health care provider. Make sure you discuss any questions you have with your health care provider. Document Released: 01/07/2008 Document Revised: 03/09/2016 Document Reviewed: 04/14/2015 Elsevier Interactive Patient Education  2018 [place] 18-39 Years, Male Preventive care refers to lifestyle choices and visits with your health care provider that can promote health and wellness. What does preventive care include?  A yearly physical exam. This is also called an annual well check.  Dental exams once or twice  a year.  Routine eye exams. Ask your health care provider how often you should have your eyes checked.  Personal lifestyle choices, including: ? Daily care of your teeth and gums. ? Regular physical activity. ? Eating a healthy diet. ? Avoiding tobacco and drug use. ? Limiting alcohol use. ? Practicing safe sex. What happens during an annual well check? The services and screenings done by your health care provider during your annual well check will depend on your age, overall health, lifestyle risk factors, and family history of disease. Counseling Your health care provider may ask you questions about your:  Alcohol use.  Tobacco use.  Drug use.  Emotional well-being.  Home and relationship well-being.  Sexual activity.  Eating habits.  Work and work Statistician.  Screening You may have the following tests or measurements:  Height, weight, and BMI.  Blood pressure.  Lipid and cholesterol levels. These may  be checked every 5 years starting at age 7.  Diabetes screening. This is done by checking your blood sugar (glucose) after you have not eaten for a while (fasting).  Skin check.  Hepatitis C blood test.  Hepatitis B blood test.  Sexually transmitted disease (STD) testing.  Discuss your test results, treatment options, and if necessary, the need for more tests with your health care provider. Vaccines Your health care provider may recommend certain vaccines, such as:  Influenza vaccine. This is recommended every year.  Tetanus, diphtheria, and acellular pertussis (Tdap, Td) vaccine. You may need a Td booster every 10 years.  Varicella vaccine. You may need this if you have not been vaccinated.  HPV vaccine. If you are 63 or younger, you may need three doses over 6 months.  Measles, mumps, and rubella (MMR) vaccine. You may need at least one dose of MMR.You may also need a second dose.  Pneumococcal 13-valent conjugate (PCV13) vaccine. You may need this if you have certain conditions and have not been vaccinated.  Pneumococcal polysaccharide (PPSV23) vaccine. You may need one or two doses if you smoke cigarettes or if you have certain conditions.  Meningococcal vaccine. One dose is recommended if you are age 37-21 years and a first-year college student living in a residence hall, or if you have one of several medical conditions. You may also need additional booster doses.  Hepatitis A vaccine. You may need this if you have certain conditions or if you travel or work in places where you may be exposed to hepatitis A.  Hepatitis B vaccine. You may need this if you have certain conditions or if you travel or work in places where you may be exposed to hepatitis B.  Haemophilus influenzae type b (Hib) vaccine. You may need this if you have certain risk factors.  Talk to your health care provider about which screenings and vaccines you need and how often you need them. This  information is not intended to replace advice given to you by your health care provider. Make sure you discuss any questions you have with your health care provider. Document Released: 09/06/2001 Document Revised: 03/30/2016 Document Reviewed: 05/12/2015 Elsevier Interactive Patient Education  [employer].

## 2018-02-06 ENCOUNTER — Telehealth: Payer: Self-pay | Admitting: Physician Assistant

## 2018-02-06 DIAGNOSIS — Z111 Encounter for screening for respiratory tuberculosis: Secondary | ICD-10-CM | POA: Diagnosis not present

## 2018-02-06 NOTE — Telephone Encounter (Signed)
Pt message to Jeremy Moran re: quantiferson TB test orders needed

## 2018-02-06 NOTE — Telephone Encounter (Signed)
Copied from [place] [id number]. Topic: General - Other >> Feb 06, 2018  8:20 AM Carolyn Stare wrote:  Pt is requesting a quantiferson TB gold for school requirement .need an order place    Contact number [phone]   >> Feb 06, 2018 11:38 AM Synthia Innocent wrote: Patient calling again, requesting to have done today

## 2018-02-07 NOTE — Telephone Encounter (Signed)
I've not seen him in over a year.

## 2019-01-22 ENCOUNTER — Other Ambulatory Visit: Payer: Self-pay

## 2019-01-22 ENCOUNTER — Encounter: Payer: Self-pay | Admitting: Registered Nurse

## 2019-01-22 ENCOUNTER — Ambulatory Visit (INDEPENDENT_AMBULATORY_CARE_PROVIDER_SITE_OTHER): Payer: No Typology Code available for payment source | Admitting: Registered Nurse

## 2019-01-22 VITALS — BP 116/67 | HR 67 | Temp 99.0°F | Resp 16 | Ht 75.0 in | Wt 218.0 lb

## 2019-01-22 DIAGNOSIS — Z13220 Encounter for screening for lipoid disorders: Secondary | ICD-10-CM

## 2019-01-22 DIAGNOSIS — Z111 Encounter for screening for respiratory tuberculosis: Secondary | ICD-10-CM

## 2019-01-22 DIAGNOSIS — Z130 Encounter for screening for diseases of the blood and blood-forming organs and certain disorders involving the immune mechanism: Secondary | ICD-10-CM

## 2019-01-22 DIAGNOSIS — Z1329 Encounter for screening for other suspected endocrine disorder: Secondary | ICD-10-CM | POA: Diagnosis not present

## 2019-01-22 DIAGNOSIS — M25579 Pain in unspecified ankle and joints of unspecified foot: Secondary | ICD-10-CM

## 2019-01-22 DIAGNOSIS — Z0001 Encounter for general adult medical examination with abnormal findings: Secondary | ICD-10-CM

## 2019-01-22 DIAGNOSIS — M79645 Pain in left finger(s): Secondary | ICD-10-CM

## 2019-01-22 DIAGNOSIS — Z13228 Encounter for screening for other metabolic disorders: Secondary | ICD-10-CM

## 2019-01-22 DIAGNOSIS — L210 Seborrhea capitis: Secondary | ICD-10-CM

## 2019-01-22 DIAGNOSIS — K219 Gastro-esophageal reflux disease without esophagitis: Secondary | ICD-10-CM

## 2019-01-22 DIAGNOSIS — L659 Nonscarring hair loss, unspecified: Secondary | ICD-10-CM

## 2019-01-22 MED ORDER — FAMOTIDINE 40 MG PO TABS: 20.0000 mg | ORAL_TABLET | Freq: Every day | ORAL | 3 refills | Status: AC

## 2019-01-22 MED ORDER — KETOCONAZOLE 2 % EX SHAM: 1.0000 "application " | MEDICATED_SHAMPOO | CUTANEOUS | 0 refills | Status: DC

## 2019-01-22 NOTE — Patient Instructions (Signed)
° ° ° °  If you have lab work done today you will be contacted with your lab results within the next 2 weeks.  If you have not heard from us then please contact us. The fastest way to get your results is to register for My Chart. ° ° °IF you received an x-ray today, you will receive an invoice from [facility]. Please contact [facility] at [phone] with questions or concerns regarding your invoice.  ° °IF you received labwork today, you will receive an invoice from [facility]. Please contact [facility] at [phone] with questions or concerns regarding your invoice.  ° °Our billing staff will not be able to assist you with questions regarding bills from these companies. ° °You will be contacted with the lab results as soon as they are available. The fastest way to get your results is to activate your My Chart account. Instructions are located on the last page of this paperwork. If you have not heard from us regarding the results in 2 weeks, please contact this office. °  ° ° ° °

## 2019-01-22 NOTE — Progress Notes (Signed)
Established Patient Office Visit  Subjective:  Patient ID: Jeremy Moran, male    DOB: Dec 15, 1985  Age: 33 y.o. MRN: [id number]  CC:  Chief Complaint  Patient presents with  . Annual Exam    HPI Jeremy Moran  Is a pleasant 33 y.o. male presenting today for CPE and labs. He has a medical history significant for ADD and Allergies. He does not take medication for his ADD and hasn't in over 1 decade. He takes OTC medications for his allergies.  Today, he complains of joint pain in fingers and toes, as well as L shoulder. He is typically a very active person - L shoulder is likely overuse d/t lack of limited ROM and inability to reproduce pain on exam.   He also states that he has been having GERD symptoms that are not relieved by 93m Omeprazole PO qd. He has a hiatal hernia for which he was recommended to take OTCs. He has not tried H2RAs. He has tried antacids without effect. He monitors his diet to try to limit triggers. He recently had a friend dx with colon cancer - as such, he is very concerned for Barrett's esophagus.   He also reports his hair is thinning - he has been told by his wife that it is "patchy". He has also noticed flakes that he is concerned for psoriasis. He also has a number of moles he is concerned about.He has never seen a dermatologist.   Past Medical History:  Diagnosis Date  . ADD (attention deficit disorder)   . Allergy     Past Surgical History:  Procedure Laterality Date  . HERNIA REPAIR      Family History  Problem Relation Age of Onset  . Cancer Father        pancreatic  . Heart disease Maternal Grandmother   . Hypertension Maternal Grandmother   . Stroke Maternal Grandmother   . Cancer Paternal Grandmother     Social History   Socioeconomic History  . Marital status: Married    Spouse name: Not on file  . Number of children: 1  . Years of education: Not on file  . Highest education level: Not on file  Occupational History  . Not  on file  Social Needs  . Financial resource strain: Not hard at all  . Food insecurity    Worry: Never true    Inability: Never true  . Transportation needs    Medical: No    Non-medical: No  Tobacco Use  . Smoking status: Never Smoker  . Smokeless tobacco: Never Used  Substance and Sexual Activity  . Alcohol use: Yes  . Drug use: No  . Sexual activity: Not Currently  Lifestyle  . Physical activity    Days per week: 5 days    Minutes per session: 60 min  . Stress: Only a little  Relationships  . Social [place] phone: Three times a week    Gets together: Twice a week    Attends religious service: Patient refused    Active member of club or organization: Patient refused    Attends meetings of clubs or organizations: Patient refused    Relationship status: Married  . Intimate partner violence    Fear of current or ex partner: No    Emotionally abused: No    Physically abused: No    Forced sexual activity: No  Other Topics Concern  . Not on file  Social History Narrative  .  Not on file    Outpatient Medications Prior to Visit  Medication Sig Dispense Refill  . cetirizine (ZYRTEC) 10 MG tablet Take 10 mg by mouth daily.    Marland Kitchen omeprazole (PRILOSEC) 10 MG capsule Take 10 mg by mouth daily.     No facility-administered medications prior to visit.     No Known Allergies  ROS Review of Systems  Constitutional: Negative.   HENT: Negative.   Eyes: Negative.   Respiratory: Negative.   Cardiovascular: Negative.   Gastrointestinal: Negative.   Endocrine: Negative.   Genitourinary: Negative.   Musculoskeletal: Positive for arthralgias. Negative for back pain and joint swelling.  Skin: Negative.   Allergic/Immunologic: Negative.   Neurological: Negative.   Hematological: Negative.   Psychiatric/Behavioral: Negative.   All other systems reviewed and are negative.     Objective:    Physical Exam  Constitutional: He is oriented to person, place, and  time. He appears well-developed and well-nourished. No distress.  HENT:  Head: Normocephalic and atraumatic.  Right Ear: External ear normal.  Left Ear: External ear normal.  Nose: Nose normal.  Mouth/Throat: Oropharynx is clear and moist. No oropharyngeal exudate.  Eyes: Pupils are equal, round, and reactive to light. Conjunctivae and EOM are normal. Right eye exhibits no discharge. Left eye exhibits no discharge. No scleral icterus.  Neck: Normal range of motion. Neck supple. No tracheal deviation present. No thyromegaly present.  Cardiovascular: Normal rate, regular rhythm, normal heart sounds and intact distal pulses. Exam reveals no gallop and no friction rub.  No murmur heard. Pulmonary/Chest: Effort normal and breath sounds normal. No respiratory distress. He has no wheezes. He has no rales. He exhibits no tenderness.  Abdominal: Soft. Bowel sounds are normal. He exhibits no distension and no mass. There is no abdominal tenderness. There is no rebound and no guarding.  Musculoskeletal: Normal range of motion.        General: No tenderness, deformity or edema.  Neurological: He is oriented to person, place, and time. No cranial nerve deficit.  Skin: Skin is warm and dry. No rash noted. He is not diaphoretic. No erythema. No pallor.  Psychiatric: He has a normal mood and affect. His behavior is normal. Judgment and thought content normal.  Nursing note and vitals reviewed.   BP 116/67   Pulse 67   Temp 99 F (37.2 C) (Oral)   Resp 16   Ht '6\' 3"'  (1.905 m)   Wt 218 lb (98.9 kg)   SpO2 97%   BMI 27.25 kg/m  Wt Readings from Last 3 Encounters:  01/22/19 218 lb (98.9 kg)  01/10/18 214 lb 12.8 oz (97.4 kg)  01/19/17 215 lb 12.8 oz (97.9 kg)     There are no preventive care reminders to display for this patient.  There are no preventive care reminders to display for this patient.  Lab Results  Component Value Date   TSH 1.387 04/18/2012   Lab Results  Component Value Date    WBC 7.0 06/30/2016   HGB 14.6 06/30/2016   HCT 40.1 06/30/2016   MCV 88 06/30/2016   PLT 143 (L) 06/30/2016   Lab Results  Component Value Date   NA 146 (H) 06/30/2016   K 4.2 06/30/2016   CO2 22 06/30/2016   GLUCOSE 80 06/30/2016   BUN 26 (H) 06/30/2016   CREATININE 1.09 06/30/2016   BILITOT 0.7 04/18/2012   ALKPHOS 57 04/18/2012   AST 16 04/18/2012   ALT 24 04/18/2012   PROT 6.6  04/18/2012   ALBUMIN 4.6 04/18/2012   CALCIUM 9.3 06/30/2016   No results found for: CHOL No results found for: HDL No results found for: LDLCALC No results found for: TRIG No results found for: CHOLHDL No results found for: HGBA1C    Assessment & Plan:   Problem List Items Addressed This Visit    None    Visit Diagnoses    Screening for endocrine, metabolic and immunity disorder    -  Primary   Relevant Orders   Comprehensive metabolic panel   TSH   Lipid screening       Relevant Orders   Lipid panel   Screening for tuberculosis       Relevant Orders   QuantiFERON-TB Gold Plus      No orders of the defined types were placed in this encounter.   Follow-up: No follow-ups on file.   PLAN:  Labs ordered. Will follow up as warranted.  Referral to derm: dandruff and mole check.  Dandruff: Nizoral shampoo sent to pharmacy. Use twice weekly.  Given aches, family history of RA, Rash on scalp, ESR ordered - will follow with ANA and rheum work up if warranted.  Joint pain: likely overuse injuries based on history and physical. Rest, ice, compression, elevation.   GERD: Referred to GI, given famotidine 47m to take nightly in addition to Omeprazole.  Patient encouraged to call clinic with any questions, comments, or concerns.    RMaximiano Coss NP

## 2019-01-23 NOTE — Progress Notes (Signed)
Good morning Jeremy Moran-  Most of your lab results are back. Everything's looking pretty good. Your Albumin/Globulin ratio is mildly elevated - given your other normal results, I'm not too concerned at this time. We can follow this up in a few months / next year with another CMP and a CBC. Your triglycerides are elevated to 167 - without elevations in your cholesterol and LDL, this isolated result is likely just a result of recent diet. Otherwise, the rest of your CMP, TSH, and lipid panel are normal. I don't think we have any actionable information at this time.  Your quantiferon gold results have not come in just yet - I'll let you know when they do. It's normal for them to take a little longer than the other test results. Hopefully you can get in with those specialists soon and all goes well - I'll be following any charts CC'd to me from those visits. Please call with any questions, comments, or concerns. Thank you,  Kathrin Ruddy, NP

## 2019-01-24 LAB — COMPREHENSIVE METABOLIC PANEL
ALT: 26 IU/L (ref 0–44)
AST: 16 IU/L (ref 0–40)
Albumin/Globulin Ratio: 2.6 — ABNORMAL HIGH (ref 1.2–2.2)
Albumin: 4.7 g/dL (ref 4.0–5.0)
Alkaline Phosphatase: 74 IU/L (ref 39–117)
BUN/Creatinine Ratio: 16 (ref 9–20)
BUN: 16 mg/dL (ref 6–20)
Bilirubin Total: 0.9 mg/dL (ref 0.0–1.2)
CO2: 24 mmol/L (ref 20–29)
Calcium: 9.7 mg/dL (ref 8.7–10.2)
Chloride: 104 mmol/L (ref 96–106)
Creatinine, Ser: 1.03 mg/dL (ref 0.76–1.27)
GFR calc Af Amer: 110 mL/min/{1.73_m2} (ref >59)
GFR calc non Af Amer: 95 mL/min/{1.73_m2} (ref >59)
Globulin, Total: 1.8 g/dL (ref 1.5–4.5)
Glucose: 96 mg/dL (ref 65–99)
Potassium: 4.1 mmol/L (ref 3.5–5.2)
Sodium: 141 mmol/L (ref 134–144)
Total Protein: 6.5 g/dL (ref 6.0–8.5)

## 2019-01-24 LAB — LIPID PANEL
Chol/HDL Ratio: 3.8 ratio (ref 0.0–5.0)
Cholesterol, Total: 165 mg/dL (ref 100–199)
HDL: 44 mg/dL (ref >39)
LDL Calculated: 88 mg/dL (ref 0–99)
Triglycerides: 167 mg/dL — ABNORMAL HIGH (ref 0–149)
VLDL Cholesterol Cal: 33 mg/dL (ref 5–40)

## 2019-01-24 LAB — QUANTIFERON-TB GOLD PLUS
QuantiFERON Mitogen Value: >10 IU/mL
QuantiFERON Nil Value: 0.07 IU/mL
QuantiFERON TB1 Ag Value: 0.07 IU/mL
QuantiFERON TB2 Ag Value: 0.07 IU/mL
QuantiFERON-TB Gold Plus: NEGATIVE

## 2019-01-24 LAB — TSH: TSH: 1.85 u[IU]/mL (ref 0.450–4.500)

## 2019-01-31 ENCOUNTER — Ambulatory Visit (INDEPENDENT_AMBULATORY_CARE_PROVIDER_SITE_OTHER): Payer: No Typology Code available for payment source | Admitting: Orthopaedic Surgery

## 2019-01-31 ENCOUNTER — Other Ambulatory Visit: Payer: Self-pay

## 2019-01-31 ENCOUNTER — Ambulatory Visit (INDEPENDENT_AMBULATORY_CARE_PROVIDER_SITE_OTHER): Payer: No Typology Code available for payment source

## 2019-01-31 ENCOUNTER — Encounter: Payer: Self-pay | Admitting: Orthopaedic Surgery

## 2019-01-31 VITALS — Ht 75.0 in | Wt 218.0 lb

## 2019-01-31 DIAGNOSIS — M79642 Pain in left hand: Secondary | ICD-10-CM

## 2019-01-31 NOTE — Progress Notes (Signed)
Office Visit Note   Patient: Jeremy Moran           Date of Birth: 16-Nov-1985           MRN: [id number] Visit Date: 01/31/2019              Requested by: Maximiano Coss, NP [place],  [place] [address] PCP: Maximiano Coss, NP   Assessment & Plan: Visit Diagnoses:  1. Pain in left hand     Plan: Impression is left index finger pain is likely due to overuse and irritation of the lumbricals.  I recommend relative rest and more regular use of oral NSAIDs.  Overall it is feeling better since he has been doing less CrossFit.  Questions encouraged and answered.  Follow-up as needed.  Follow-Up Instructions: Return if symptoms worsen or fail to improve.   Orders:  Orders Placed This Encounter  Procedures  . XR Hand Complete Left   No orders of the defined types were placed in this encounter.     Procedures: No procedures performed   Clinical Data: No additional findings.   Subjective: Chief Complaint  Patient presents with  . Left Hand - Pain    Jeremy Moran is a 33 year old healthy left-hand-dominant gentleman who comes in for evaluation of pain and discomfort around the index finger.  He does not do a lot of cross fit and he denies any definite injuries.  He states that he will have a sudden shooting pain that will cause him to drop things from his left hand.  Denies any numbness and tingling.  He does not take ibuprofen regularly but does seem to help.  He denies any triggering.  The pain is, localized around the MCP joint that will sometimes radiate down into the finger.   Review of Systems  Constitutional: Negative.   All other systems reviewed and are negative.    Objective: Vital Signs: Ht 6\' 3"  (1.905 m)   Wt 218 lb (98.9 kg)   BMI 27.25 kg/m   Physical Exam Vitals signs and nursing note reviewed.  Constitutional:      Appearance: He is well-developed.  HENT:     Head: Normocephalic and atraumatic.  Eyes:     Pupils: Pupils are equal,  round, and reactive to light.  Neck:     Musculoskeletal: Neck supple.  Pulmonary:     Effort: Pulmonary effort is normal.  Abdominal:     Palpations: Abdomen is soft.  Musculoskeletal: Normal range of motion.  Skin:    General: Skin is warm.  Neurological:     Mental Status: He is alert and oriented to person, place, and time.  Psychiatric:        Behavior: Behavior normal.        Thought Content: Thought content normal.        Judgment: Judgment normal.     Ortho Exam Left index and hand exam show full motor and sensory function.  He has some discomfort with resisted MCP extension from 90 degrees as well as extension of the PIP joint.  His central slip and terminal tendon and extensor tendons are intact.  Flexor tendons are intact.  Interossei muscles are non-symptomatic with resistance. Specialty Comments:  No specialty comments available.  Imaging: Xr Hand Complete Left  Result Date: 01/31/2019 No acute or structural abnormalities    PMFS History: Patient Active Problem List   Diagnosis Date Noted  . Attention deficit disorder 04/19/2012   Past Medical History:  Diagnosis  Date  . ADD (attention deficit disorder)   . Allergy     Family History  Problem Relation Age of Onset  . Cancer Father        pancreatic  . Heart disease Maternal Grandmother   . Hypertension Maternal Grandmother   . Stroke Maternal Grandmother   . Cancer Paternal Grandmother     Past Surgical History:  Procedure Laterality Date  . HERNIA REPAIR     Social History   Occupational History  . Not on file  Tobacco Use  . Smoking status: Never Smoker  . Smokeless tobacco: Never Used  Substance and Sexual Activity  . Alcohol use: Yes  . Drug use: No  . Sexual activity: Not Currently

## 2019-02-05 ENCOUNTER — Encounter: Payer: Self-pay | Admitting: Registered Nurse

## 2019-03-01 ENCOUNTER — Encounter: Payer: Self-pay | Admitting: Registered Nurse

## 2019-05-15 ENCOUNTER — Encounter: Payer: Self-pay | Admitting: Registered Nurse

## 2019-05-20 ENCOUNTER — Other Ambulatory Visit: Payer: Self-pay

## 2019-05-20 DIAGNOSIS — D239 Other benign neoplasm of skin, unspecified: Secondary | ICD-10-CM

## 2021-02-03 ENCOUNTER — Telehealth: Payer: Self-pay | Admitting: Internal Medicine

## 2021-02-03 DIAGNOSIS — J0100 Acute maxillary sinusitis, unspecified: Secondary | ICD-10-CM

## 2021-02-03 MED ORDER — AMOXICILLIN-POT CLAVULANATE 875-125 MG PO TABS: 1.0000 | ORAL_TABLET | Freq: Two times a day (BID) | ORAL | 0 refills | Status: AC

## 2021-02-03 NOTE — Telephone Encounter (Signed)
?   Sinus infection

## 2021-08-04 ENCOUNTER — Encounter: Payer: Self-pay | Admitting: Gastroenterology

## 2021-08-04 ENCOUNTER — Ambulatory Visit (INDEPENDENT_AMBULATORY_CARE_PROVIDER_SITE_OTHER): Payer: No Typology Code available for payment source | Admitting: Family Medicine

## 2021-08-04 ENCOUNTER — Encounter: Payer: Self-pay | Admitting: Family Medicine

## 2021-08-04 ENCOUNTER — Other Ambulatory Visit: Payer: Self-pay

## 2021-08-04 VITALS — BP 118/76 | HR 73 | Temp 97.8°F | Ht 75.0 in | Wt 220.1 lb

## 2021-08-04 DIAGNOSIS — Z0000 Encounter for general adult medical examination without abnormal findings: Secondary | ICD-10-CM | POA: Diagnosis not present

## 2021-08-04 DIAGNOSIS — R1319 Other dysphagia: Secondary | ICD-10-CM | POA: Diagnosis not present

## 2021-08-04 LAB — CBC
HCT: 44.3 % (ref 39.0–52.0)
Hemoglobin: 15.3 g/dL (ref 13.0–17.0)
MCHC: 34.7 g/dL (ref 30.0–36.0)
MCV: 88.9 fl (ref 78.0–100.0)
Platelets: 152 10*3/uL (ref 150.0–400.0)
RBC: 4.98 Mil/uL (ref 4.22–5.81)
RDW: 13.1 % (ref 11.5–15.5)
WBC: 5.7 10*3/uL (ref 4.0–10.5)

## 2021-08-04 LAB — COMPREHENSIVE METABOLIC PANEL
ALT: 33 U/L (ref 0–53)
AST: 22 U/L (ref 0–37)
Albumin: 4.7 g/dL (ref 3.5–5.2)
Alkaline Phosphatase: 61 U/L (ref 39–117)
BUN: 22 mg/dL (ref 6–23)
CO2: 30 mEq/L (ref 19–32)
Calcium: 9.6 mg/dL (ref 8.4–10.5)
Chloride: 102 mEq/L (ref 96–112)
Creatinine, Ser: 1.16 mg/dL (ref 0.40–1.50)
GFR: 81.23 mL/min (ref >60.00)
Glucose, Bld: 89 mg/dL (ref 70–99)
Potassium: 4.4 mEq/L (ref 3.5–5.1)
Sodium: 137 mEq/L (ref 135–145)
Total Bilirubin: 0.8 mg/dL (ref 0.2–1.2)
Total Protein: 7.1 g/dL (ref 6.0–8.3)

## 2021-08-04 LAB — LIPID PANEL
Cholesterol: 174 mg/dL (ref 0–200)
HDL: 41.6 mg/dL (ref >39.00)
LDL Cholesterol: 105 mg/dL — ABNORMAL HIGH (ref 0–99)
NonHDL: 132.04
Total CHOL/HDL Ratio: 4
Triglycerides: 135 mg/dL (ref 0.0–149.0)
VLDL: 27 mg/dL (ref 0.0–40.0)

## 2021-08-04 LAB — HEMOGLOBIN A1C: Hgb A1c MFr Bld: 5.1 % (ref 4.6–6.5)

## 2021-08-04 LAB — TSH: TSH: 2.58 u[IU]/mL (ref 0.35–5.50)

## 2021-08-04 NOTE — Progress Notes (Signed)
New Patient Office Visit  Subjective:  Patient ID: Jeremy Moran, male    DOB: 08/11/1985  Age: 36 y.o. MRN: [id number]  CC:  Chief Complaint  Patient presents with   Establish Care   Annual Exam   Hiatal Hernia    Hadn't had checked since 2014     HPI Aleczander Fandino presents for new pt to establish Annual cpx.   Has Hiatil Hernia-EGD-2014. No heartburn, but gets spasms w/eating frequently.  Water helps.  Taking pepcid/omeprazole prn. No dysphagia.   General aches/pains.   Past Medical History:  Diagnosis Date   ADD (attention deficit disorder)    Allergy     History reviewed. No pertinent surgical history.   Family History  Problem Relation Age of Onset   Cancer Father        pancreatic   Heart disease Maternal Grandmother    Hypertension Maternal Grandmother    Stroke Maternal Grandmother    Cancer Paternal Grandmother     Social History   Socioeconomic History   Marital status: Married    Spouse name: Not on file   Number of children: 1   Years of education: Not on file   Highest education level: Not on file  Occupational History   Not on file  Tobacco Use   Smoking status: Never   Smokeless tobacco: Never  Vaping Use   Vaping Use: Never used  Substance and Sexual Activity   Alcohol use: Yes    Comment: rarely   Drug use: No   Sexual activity: Not Currently  Other Topics Concern   Not on file  Social History Narrative   Daughter due 09/23/21 has 1 daughter   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-[employer]   Social Determinants of Radio broadcast assistant Strain: Not on file  Food Insecurity: Not on file  Transportation Needs: Not on file  Physical Activity: Not on file  Stress: Not on file  Social Connections: Not on file  Intimate Partner Violence: Not on file    ROS: negative/noncontributory except as in HPI  Objective:   Today's Vitals: BP 118/76    Pulse 73    Temp 97.8 F (36.6 C) (Temporal)    Ht 6\' 3"  (1.905 m)    Wt 220  lb 2 oz (99.8 kg)    SpO2 98%    BMI 27.51 kg/m   Gen: WDWN NAD muscular WM HEENT: NCAT, conjunctiva not injected, sclera nonicteric TM WNL B, OP moist, no exudates  NECK:  supple, no thyromegaly, no nodes, no carotid bruits CARDIAC: RRR, S1S2+, no murmur. DP 2+B LUNGS: CTAB. No wheezes ABDOMEN:  BS+, soft, NTND, No HSM, no masses EXT:  no edema MSK: no gross abnormalities.  Ms 5/5 all 4.  DTR 2+ all 4 NEURO: A&O x3.  CN II-XII intact.  PSYCH: normal mood. Good eye contact   Assessment & Plan:   Problem List Items Addressed This Visit   None Visit Diagnoses     Wellness examination    -  Primary   Other dysphagia           Wellness-[facility] guidance.  Check labs Hiatal hernia-w/spasms.  On meds 10 yrs.  Refer GI  F/u 1 yr  Outpatient Encounter Medications as of 08/04/2021  Medication Sig   famotidine (PEPCID) 40 MG tablet Take 0.5 tablets (20 mg total) by mouth daily.   fluticasone (FLONASE) 50 MCG/ACT nasal spray Place into both nostrils daily.   omeprazole (  PRILOSEC) 10 MG capsule Take 10 mg by mouth daily.   [DISCONTINUED] cetirizine (ZYRTEC) 10 MG tablet Take 10 mg by mouth daily. (Patient not taking: Reported on 08/04/2021)   [DISCONTINUED] ketoconazole (NIZORAL) 2 % shampoo Apply 1 application topically 2 (two) times a week. (Patient not taking: Reported on 01/31/2019)   No facility-administered encounter medications on file as of 08/04/2021.    Follow-up: No follow-ups on file.   Wellington Hampshire, MD

## 2021-08-04 NOTE — Patient Instructions (Signed)
Welcome to [facility] at [place]! It was a pleasure meeting you today. ° °As discussed, Please schedule a 12 month follow up visit today. ° °PLEASE NOTE: ° °If you had any LAB tests please let us know if you have not heard back within a few days. You may see your results on MyChart before we have a chance to review them but we will give you a call once they are reviewed by us. If we ordered any REFERRALS today, please let us know if you have not heard from their office within the next week.  °Let us know through MyChart if you are needing REFILLS, or have your pharmacy send us the request. You can also use MyChart to communicate with me or any office staff. ° °Please try these tips to maintain a healthy lifestyle: ° °Eat most of your calories during the day when you are active. Eliminate processed foods including packaged sweets (pies, cakes, cookies), reduce intake of potatoes, white bread, white pasta, and white rice. Look for whole grain options, oat flour or almond flour. ° °Each meal should contain half fruits/vegetables, one quarter protein, and one quarter carbs (no bigger than a computer mouse). ° °Cut down on sweet beverages. This includes juice, soda, and sweet tea. Also watch fruit intake, though this is a healthier sweet option, it still contains natural sugar! Limit to 3 servings daily. ° °Drink at least 1 glass of water with each meal and aim for at least 8 glasses per day ° °Exercise at least 150 minutes every week.   °

## 2021-08-23 ENCOUNTER — Ambulatory Visit (INDEPENDENT_AMBULATORY_CARE_PROVIDER_SITE_OTHER): Payer: No Typology Code available for payment source | Admitting: Gastroenterology

## 2021-08-23 ENCOUNTER — Encounter: Payer: Self-pay | Admitting: Gastroenterology

## 2021-08-23 VITALS — BP 110/60 | HR 72 | Ht 75.0 in | Wt 225.4 lb

## 2021-08-23 DIAGNOSIS — R1310 Dysphagia, unspecified: Secondary | ICD-10-CM

## 2021-08-23 DIAGNOSIS — R1319 Other dysphagia: Secondary | ICD-10-CM

## 2021-08-23 NOTE — Progress Notes (Signed)
HPI : Jeremy Moran is a very pleasant 36 year old male with ADHD who is referred to [place] by Dr. Tawnya Crook for dysphagia.  He states that he has had dysphagia for many (10+) years.  He was evaluated by [facility] and underwent an EGD in 2014 which was notable for LA Grade A esophagitis and a small hiatal hernia.  The esophagus was otherwise unremarkable and no esophageal biopsies were taken.   He describes the sensation of food getting stuck in his chest, causing a 'spasm'.   He would try to drink water to get the food to do down, and would feel the column of water rising in his chest.  He has had to forcefully bring up food that would not go down.  He has symptoms of dysphagia on a daily basis, noted mostly with foods such as dry meats, bread and rice.  He only has symptoms with eating, and denies episodes of chest pain otherwise.  He denies symptoms of heartburn or acid regurgitation.   His weight has been stable.  No liquid dysphagia. He takes Pepcid and omeprazole as needed for foods that he has difficulty swallowing.  He doesn't think these meds help very much.  He has a history of environmental allergies, but no history of asthma/eczema.   Past Medical History:  Diagnosis Date   ADD (attention deficit disorder)    Allergy    GERD (gastroesophageal reflux disease)      No past surgical history on file. Family History  Problem Relation Age of Onset   Hypertension Father    Cancer Father        pancreatic   Stroke Maternal Grandmother    Heart disease Maternal Grandmother    Hyperlipidemia Maternal Grandfather    Heart disease Maternal Grandfather    Heart disease Paternal Grandmother    Cancer Paternal Grandmother    Diabetes Paternal Grandfather    Hypertension Paternal Grandfather    Heart disease Paternal Grandfather    Social History   Tobacco Use   Smoking status: Never   Smokeless tobacco: Never  Vaping Use   Vaping Use: Never used  Substance Use Topics    Alcohol use: Yes    Comment: rarely   Drug use: No  Works as an Therapist, 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 90 tablet 3   fluticasone (FLONASE) 50 MCG/ACT nasal spray Place into both nostrils daily.     omeprazole (PRILOSEC) 10 MG capsule Take 10 mg by mouth daily.     No current facility-administered medications for this visit.   No Known Allergies   Review of Systems: All systems reviewed and negative except where noted in HPI.    No results found.  Physical Exam: BP 110/60 (BP Location: Left Arm, Patient Position: Sitting, Cuff Size: Normal)    Pulse 72    Ht 6\' 3"  (1.905 m)    Wt 225 lb 6.4 oz (102.2 kg)    SpO2 97%    BMI 28.17 kg/m  Constitutional: Pleasant,well-developed, Caucasian male in no acute distress. HEENT: Normocephalic and atraumatic. Conjunctivae are normal. No scleral icterus. MP1 Neck supple.  Cardiovascular: Normal rate, regular rhythm.  Pulmonary/chest: Effort normal and breath sounds normal. No wheezing, rales or rhonchi. Abdominal: Soft, nondistended, nontender. Bowel sounds active throughout. There are no masses palpable. No hepatomegaly.  Extremities: no edema Neurological: Alert and oriented to person place and time. Skin: Skin is warm and dry. No rashes noted. Psychiatric: Normal mood and affect. Behavior is normal.  CBC    Component Value Date/Time   WBC 5.7 08/04/2021 1127   RBC 4.98 08/04/2021 1127   HGB 15.3 08/04/2021 1127   HGB 14.6 06/30/2016 1630   HCT 44.3 08/04/2021 1127   HCT 40.1 06/30/2016 1630   PLT 152.0 08/04/2021 1127   PLT 143 (L) 06/30/2016 1630   MCV 88.9 08/04/2021 1127   MCV 88 06/30/2016 1630   MCH 31.9 06/30/2016 1630   MCH 31.7 04/18/2012 1656   MCHC 34.7 08/04/2021 1127   RDW 13.1 08/04/2021 1127   RDW 13.8 06/30/2016 1630   LYMPHSABS 1.7 06/30/2016 1630   MONOABS 0.5 04/18/2012  1656   EOSABS 0.1 06/30/2016 1630   BASOSABS 0.0 06/30/2016 1630    CMP     Component Value Date/Time   NA 137 08/04/2021 1127   NA 141 01/22/2019 1629   K 4.4 08/04/2021 1127   CL 102 08/04/2021 1127   CO2 30 08/04/2021 1127   GLUCOSE 89 08/04/2021 1127   BUN 22 08/04/2021 1127   BUN 16 01/22/2019 1629   CREATININE 1.16 08/04/2021 1127   CREATININE 1.10 04/18/2012 1656   CALCIUM 9.6 08/04/2021 1127   PROT 7.1 08/04/2021 1127   PROT 6.5 01/22/2019 1629   ALBUMIN 4.7 08/04/2021 1127   ALBUMIN 4.7 01/22/2019 1629   AST 22 08/04/2021 1127   ALT 33 08/04/2021 1127   ALKPHOS 61 08/04/2021 1127   BILITOT 0.8 08/04/2021 1127   BILITOT 0.9 01/22/2019 1629   GFRNONAA 95 01/22/2019 1629   GFRAA 110 01/22/2019 1629     ASSESSMENT AND PLAN: 36 year old male with chronic nonprogressive solid dysphagia without red flag symptoms, previously found to have LA Grade A esophagitis and a small hiatal hernia on EGD and diagnosed with GERD.  Esophageal biopsies not taken at that time.  He denies typical GERD symptoms.  Symptoms not improved with PPI.  His symptoms and demographics seem more consistent with eosinophilic esophagitis.  Will plan for EGD with esophageal biopsies and possible dilation.  Advised patient to avoid omeprazole until EGD, use Pepcid as needed.  Dysphagia - EGD - Stop omeprazole, use Pepcid PRN  The details, risks (including bleeding, perforation, infection, missed lesions, medication reactions and possible hospitalization or surgery if complications occur), benefits, and alternatives to EGD with possible biopsy and possible dilation were discussed with the patient and he consents to proceed.   Selinda Korzeniewski E. Candis Schatz, MD [facility]   CC:  Tawnya Crook, MD

## 2021-08-23 NOTE — Patient Instructions (Signed)
If you are age 37 or older, your body mass index should be between 23-30. Your Body mass index is 28.17 kg/m. If this is out of the aforementioned range listed, please consider follow up with your Primary Care Provider.  If you are age 97 or younger, your body mass index should be between 19-25. Your Body mass index is 28.17 kg/m. If this is out of the aformentioned range listed, please consider follow up with your Primary Care Provider.   You have been scheduled for an endoscopy. Please follow written instructions given to you at your visit today. If you use inhalers (even only as needed), please bring them with you on the day of your procedure.  The [facility] providers would like to encourage you to use [facility] to communicate with providers for non-urgent requests or questions.  Due to long hold times on the telephone, sending your provider a message by [facility] may be a faster and more efficient way to get a response.  Please allow 48 business hours for a response.  Please remember that this is for non-urgent requests.   It was a pleasure to see you today!  Thank you for trusting me with your gastrointestinal care!    Scott E.Candis Schatz, MD

## 2021-08-24 ENCOUNTER — Encounter: Payer: Self-pay | Admitting: Gastroenterology

## 2021-10-18 ENCOUNTER — Ambulatory Visit (AMBULATORY_SURGERY_CENTER): Payer: No Typology Code available for payment source | Admitting: Gastroenterology

## 2021-10-18 ENCOUNTER — Encounter: Payer: Self-pay | Admitting: Gastroenterology

## 2021-10-18 ENCOUNTER — Other Ambulatory Visit (HOSPITAL_COMMUNITY): Payer: Self-pay

## 2021-10-18 VITALS — BP 104/71 | HR 58 | Temp 97.8°F | Resp 14 | Ht 75.0 in | Wt 225.0 lb

## 2021-10-18 DIAGNOSIS — K449 Diaphragmatic hernia without obstruction or gangrene: Secondary | ICD-10-CM

## 2021-10-18 DIAGNOSIS — R1319 Other dysphagia: Secondary | ICD-10-CM | POA: Diagnosis not present

## 2021-10-18 DIAGNOSIS — K209 Esophagitis, unspecified without bleeding: Secondary | ICD-10-CM

## 2021-10-18 DIAGNOSIS — K2100 Gastro-esophageal reflux disease with esophagitis, without bleeding: Secondary | ICD-10-CM

## 2021-10-18 DIAGNOSIS — K229 Disease of esophagus, unspecified: Secondary | ICD-10-CM

## 2021-10-18 DIAGNOSIS — K200 Eosinophilic esophagitis: Secondary | ICD-10-CM | POA: Diagnosis not present

## 2021-10-18 MED ORDER — RABEPRAZOLE SODIUM 20 MG PO TBEC
20.0000 mg | DELAYED_RELEASE_TABLET | Freq: Every day | ORAL | 0 refills | Status: DC
  Filled 2021-10-18: qty 30, 30d supply, fill #0

## 2021-10-18 MED ORDER — SODIUM CHLORIDE 0.9 % IV SOLN: 500.0000 mL | Freq: Once | INTRAVENOUS | Status: DC

## 2021-10-18 MED ORDER — RABEPRAZOLE SODIUM 20 MG PO TBEC: 20.0000 mg | DELAYED_RELEASE_TABLET | Freq: Every day | ORAL | 0 refills | Status: DC

## 2021-10-18 NOTE — Progress Notes (Signed)
VS completed by DT.    Medical history reviewed and updated.  

## 2021-10-18 NOTE — Op Note (Signed)
[facility] ?Patient Name: Jeremy Moran ?Procedure Date: 10/18/2021 9:03 AM ?MRN: [id number] ?Endoscopist: Seerat Peaden E. Candis Schatz , MD ?Age: 36 ?Referring MD:  ?Date of Birth: [birth year] ?Gender: Male ?Account #: [id number] ?Procedure:                Upper GI endoscopy ?Indications:              Dysphagia ?Medicines:                Monitored Anesthesia Care ?Procedure:                Pre-Anesthesia Assessment: ?                          - Prior to the procedure, a History and Physical  ?                          was performed, and patient medications and  ?                          allergies were reviewed. The patient's tolerance of  ?                          previous anesthesia was also reviewed. The risks  ?                          and benefits of the procedure and the sedation  ?                          options and risks were discussed with the patient.  ?                          All questions were answered, and informed consent  ?                          was obtained. Prior Anticoagulants: The patient has  ?                          taken no previous anticoagulant or antiplatelet  ?                          agents. ASA Grade Assessment: II - A patient with  ?                          mild systemic disease. After reviewing the risks  ?                          and benefits, the patient was deemed in  ?                          satisfactory condition to undergo the procedure. ?                          After obtaining informed consent, the endoscope was  ?  passed under direct vision. Throughout the  ?                          procedure, the patient's blood pressure, pulse, and  ?                          oxygen saturations were monitored continuously. The  ?                          GIF [id number] #[id number] was introduced through the  ?                          mouth, and advanced to the third part of duodenum.  ?                          The upper GI endoscopy was accomplished  without  ?                          difficulty. The patient tolerated the procedure  ?                          well. ?Scope In: ?Scope Out: ?Findings:                 The examined portions of the nasopharynx,  ?                          oropharynx and larynx were normal. ?                          LA Grade A (one or more mucosal breaks less than 5  ?                          mm, not extending between tops of 2 mucosal folds)  ?                          esophagitis with no bleeding was found. ?                          The exam of the esophagus was otherwise normal. ?                          Biopsies were taken with a cold forceps in the  ?                          proximal esophagus and in the distal esophagus for  ?                          histology. ?                          Esophagogastric landmarks were identified: the  ?                          Z-line was found at 40 cm,  the upper extent of the  ?                          gastric folds was found at 40 cm and the site of  ?                          hiatal narrowing was found at 41 cm from the  ?                          incisors. ?                          The entire examined stomach was normal. ?                          The examined duodenum was normal. ?Complications:            No immediate complications. ?Estimated Blood Loss:     Estimated blood loss was minimal. ?Impression:               - The examined portions of the nasopharynx,  ?                          oropharynx and larynx were normal. ?                          - LA Grade A reflux esophagitis with no bleeding. ?                          - Esophagogastric landmarks identified. ?                          - Normal stomach. ?                          - Normal examined duodenum. ?                          - Biopsies were taken with a cold forceps for  ?                          histology in the proximal esophagus and in the  ?                          distal esophagus. ?Recommendation:            - Patient has a contact number available for  ?                          emergencies. The signs and symptoms of potential  ?                          delayed complications were discussed with the  ?                          patient. Return to normal activities tomorrow.  ?  Written discharge instructions were provided to the  ?                          patient. ?                          - Resume previous diet. ?                          - Continue present medications. ?                          - Await pathology results. ?                          - Use Aciphex (rabeprazole) 20 mg PO daily for 4  ?                          weeks. ?Kaleyah Labreck E. Candis Schatz, MD ?10/18/2021 9:47:14 AM ?This report has been signed electronically. ?

## 2021-10-18 NOTE — Progress Notes (Signed)
PT taken to PACU. Monitors in place. VSS. Report given to RN. 

## 2021-10-18 NOTE — Patient Instructions (Signed)
YOU HAD AN ENDOSCOPIC PROCEDURE TODAY AT THE [facility]:   Refer to the procedure report that was given to you for any specific questions about what was found during the examination.  If the procedure report does not answer your questions, please call your gastroenterologist to clarify.  If you requested that your care partner not be given the details of your procedure findings, then the procedure report has been included in a sealed envelope for you to review at your convenience later.  YOU SHOULD EXPECT: Some feelings of bloating in the abdomen. Passage of more gas than usual.  Walking can help get rid of the air that was put into your GI tract during the procedure and reduce the bloating. If you had a lower endoscopy (such as a colonoscopy or flexible sigmoidoscopy) you may notice spotting of blood in your stool or on the toilet paper. If you underwent a bowel prep for your procedure, you may not have a normal bowel movement for a few days.  Please Note:  You might notice some irritation and congestion in your nose or some drainage.  This is from the oxygen used during your procedure.  There is no need for concern and it should clear up in a day or so.  SYMPTOMS TO REPORT IMMEDIATELY:    Following upper endoscopy (EGD)  Vomiting of blood or coffee ground material  New chest pain or pain under the shoulder blades  Painful or persistently difficult swallowing  New shortness of breath  Fever of 100F or higher  Black, tarry-looking stools  For urgent or emergent issues, a gastroenterologist can be reached at any hour by calling [phone]. Do not use MyChart messaging for urgent concerns.    DIET:  We do recommend a small meal at first, but then you may proceed to your regular diet.  Drink plenty of fluids but you should avoid alcoholic beverages for 24 hours.  ACTIVITY:  You should plan to take it easy for the rest of today and you should NOT DRIVE or use heavy machinery  until tomorrow (because of the sedation medicines used during the test).    FOLLOW UP: Our staff will call the number listed on your records 48-72 hours following your procedure to check on you and address any questions or concerns that you may have regarding the information given to you following your procedure. If we do not reach you, we will leave a message.  We will attempt to reach you two times.  During this call, we will ask if you have developed any symptoms of COVID 19. If you develop any symptoms (ie: fever, flu-like symptoms, shortness of breath, cough etc.) before then, please call [phone].  If you test positive for Covid 19 in the 2 weeks post procedure, please call and report this information to us.    If any biopsies were taken you will be contacted by phone or by letter within the next 1-3 weeks.  Please call us at [phone] if you have not heard about the biopsies in 3 weeks.    SIGNATURES/CONFIDENTIALITY: You and/or your care partner have signed paperwork which will be entered into your electronic medical record.  These signatures attest to the fact that that the information above on your After Visit Summary has been reviewed and is understood.  Full responsibility of the confidentiality of this discharge information lies with you and/or your care-partner. 

## 2021-10-18 NOTE — Progress Notes (Signed)
[facility] History and Physical ? ? ?Primary Care Physician:  Tawnya Crook, MD ? ? ?Reason for Procedure:   Dysphagia ? ?Plan:    EGD ? ? ? ? ?HPI: Jeremy Moran is a 36 y.o. male undergoing EGD to evaluate chronic dysphagia.  He has had dysphagia for 10+ years described as spasm like sensations in his chest when he swallows.  He has daily symptoms, more noticeable with meats, bread and rice.  No symptoms in the absence of swallowing.  No history of food impaction.  No improvement in symptoms with omeprazole. ? ?Past Medical History:  ?Diagnosis Date  ? ADD (attention deficit disorder)   ? Allergy   ? GERD (gastroesophageal reflux disease)   ? ? ?Past Surgical History:  ?Procedure Laterality Date  ? UPPER GASTROINTESTINAL ENDOSCOPY    ? ? ?Prior to Admission medications   ?Medication Sig Start Date End Date Taking? Authorizing Provider  ?famotidine (PEPCID) 40 MG tablet Take 0.5 tablets (20 mg total) by mouth daily. 01/22/19  Yes Maximiano Coss, NP  ?fluticasone (FLONASE) 50 MCG/ACT nasal spray Place into both nostrils daily.   Yes [provider]  ?omeprazole (PRILOSEC) 10 MG capsule Take 10 mg by mouth daily. ?Patient not taking: Reported on 10/18/2021    [provider]  ? ? ?Current Outpatient Medications  ?Medication Sig Dispense Refill  ? famotidine (PEPCID) 40 MG tablet Take 0.5 tablets (20 mg total) by mouth daily. 90 tablet 3  ? fluticasone (FLONASE) 50 MCG/ACT nasal spray Place into both nostrils daily.    ? omeprazole (PRILOSEC) 10 MG capsule Take 10 mg by mouth daily. (Patient not taking: Reported on 10/18/2021)    ? ?Current Facility-Administered Medications  ?Medication Dose Route Frequency Provider Last Rate Last Admin  ? 0.9 %  sodium chloride infusion  500 mL Intravenous Once Daryel November, MD      ? ? ?Allergies as of 10/18/2021  ? (No Known Allergies)  ? ? ?Family History  ?Problem Relation Age of Onset  ? Prostate cancer Father   ? Hypertension Father   ?  Stroke Maternal Grandmother   ? Heart disease Maternal Grandmother   ? Hyperlipidemia Maternal Grandfather   ? Heart disease Maternal Grandfather   ? Heart disease Paternal Grandmother   ? Cancer Paternal Grandmother   ? Diabetes Paternal Grandfather   ? Hypertension Paternal Grandfather   ? Heart disease Paternal Grandfather   ? Colon cancer Neg Hx   ? Rectal cancer Neg Hx   ? Stomach cancer Neg Hx   ? ? ?Social History  ? ?Socioeconomic History  ? Marital status: Married  ?  Spouse name: Not on file  ? Number of children: 1  ? Years of education: Not on file  ? Highest education level: Not on file  ?Occupational History  ? Not on file  ?Tobacco Use  ? Smoking status: Never  ? Smokeless tobacco: Never  ?Vaping Use  ? Vaping Use: Never used  ?Substance and Sexual Activity  ? Alcohol use: Yes  ?  Comment: rarely  ? Drug use: No  ? Sexual activity: Yes  ?Other Topics Concern  ? Not on file  ?Social History Narrative  ? Daughter due 09/23/21 has 1 daughter  ? 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 ?Social Determinants of Health  ? ?Financial Resource Strain: Not on file  ?Food Insecurity: Not on file  ?Transportation Needs: Not on file  ?Physical Activity: Not on  file  ?Stress: Not on file  ?Social Connections: Not on file  ?Intimate Partner Violence: Not on file  ? ? ?Review of Systems: ? ?All other review of systems negative except as mentioned in the HPI. ? ?Physical Exam: ?Vital signs ?BP 116/81   Pulse (!) 57   Temp 97.8 ?F (36.6 ?C) (Temporal)   Resp 14   Ht '6\' 3"'$  (1.905 m)   Wt 225 lb (102.1 kg)   SpO2 99%   BMI 28.12 kg/m?  ? ?General:   Alert,  Well-developed, well-nourished, pleasant and cooperative in NAD ?Airway:  Mallampati 1 ?Lungs:  Clear throughout to auscultation.   ?Heart:  Regular rate and rhythm; no murmurs, clicks, rubs,  or gallops. ?Abdomen:  Soft, nontender and nondistended. Normal bowel sounds.   ?Neuro/Psych:  Normal mood and affect. A and O x 3 ? ? ?Oziel Beitler E. Candis Schatz,  MD ?[facility] ? ?

## 2021-10-18 NOTE — Progress Notes (Signed)
Called to room to assist during endoscopic procedure.  Patient ID and intended procedure confirmed with present staff. Received instructions for my participation in the procedure from the performing physician.  

## 2021-10-20 ENCOUNTER — Telehealth: Payer: Self-pay

## 2021-10-20 NOTE — Telephone Encounter (Signed)
Called [phone] and left a message we tried to reach pt for a follow up call. maw  ?

## 2021-10-20 NOTE — Telephone Encounter (Signed)
Called # [phone] and left a message we tried to reach pt for a follow up call. maw  ?

## 2021-10-25 NOTE — Progress Notes (Signed)
Jeremy Moran,  ?The distal esophagus biopsies showed inflammatory changes including elevated levels of eosinophils.  The proximal esophagus biopsies did not show elevated eosinophils.  Given the presence of reflux esophagitis noted on your EGD, the absence of typical eosinophilic esophagitis mucosal abnormalities, and the distal distribution of your elevated eosinophils, I feel it is unlikely that you have eosinophilic esophagitis.   ? ?Reflux esophagitis can also cause elevated eosinophils and I believe this is the case for you.  I would recommend continuing on the Aciphex as discussed.  Please follow up with me as needed for ongoing symptoms and to reconsider dilation. ?

## 2021-11-10 ENCOUNTER — Encounter: Payer: Self-pay | Admitting: Family Medicine

## 2021-11-10 ENCOUNTER — Other Ambulatory Visit: Payer: Self-pay | Admitting: *Deleted

## 2021-11-10 ENCOUNTER — Ambulatory Visit (HOSPITAL_COMMUNITY)
Admission: RE | Admit: 2021-11-10 | Discharge: 2021-11-10 | Disposition: A | Discharge status: Home / Self Care | Payer: No Typology Code available for payment source | Source: Ambulatory Visit | Attending: Family Medicine | Admitting: Family Medicine

## 2021-11-10 ENCOUNTER — Ambulatory Visit (INDEPENDENT_AMBULATORY_CARE_PROVIDER_SITE_OTHER): Payer: No Typology Code available for payment source | Admitting: Family Medicine

## 2021-11-10 VITALS — BP 110/77 | HR 71 | Temp 98.7°F | Ht 75.0 in | Wt 221.1 lb

## 2021-11-10 DIAGNOSIS — G4489 Other headache syndrome: Secondary | ICD-10-CM | POA: Insufficient documentation

## 2021-11-10 MED ORDER — RABEPRAZOLE SODIUM 20 MG PO TBEC: 20.0000 mg | DELAYED_RELEASE_TABLET | Freq: Every day | ORAL | 0 refills | Status: DC

## 2021-11-10 NOTE — Progress Notes (Signed)
? ?Subjective:  ? ? ? Patient ID: Jeremy Moran, male    DOB: [birth year], 36 y.o.   MRN: [id number] ? ?Chief Complaint  ?Patient presents with  ? Migraine  ?  Started Wednesday around 12:45 pm, dizziness, throbbing, blurred vision at the time, now just throbbing pain and neck pain ?Has been taking Excedrin migraine with no relief  ? ? ?HPI ?Never has had HA.   Does cross-fit-15 overhead squats and run 2109mng, etc.  Pt was going at it.  Second round, some head pressure during lifting, ran 2061m Starting lifting again-severe pain/pressure head.  Laid down and couldn't do anything d/t pressure.  No neck pain.  Subsided enough to go home and laid down for 2 hrs.  Next day, co-works said didn't look right-so did EKG at work - fine.  Still w/Head ache  for 1 wk.  Tried excedrine migraine-not helping.   ?Has not been back to cross fit nor intercourse.  Ran slowly for 1014mand got HA-stopped and resolved.  Throbbing temple pain.  Intermitt "dizziness" when stands up.  Worked-mostly desk.  Nothing phsically strenuous.   ?Vitals ok.   Did 3 push ups and HA. . ?Marland Kitchensed to work in metals.  So needs orbit x-ray.  ? ? ?Had EGD-lower stricture.  Has HH ? ?Health Maintenance Due  ?Topic Date Due  ? Hepatitis C Screening  Never done  ? COVID-19 Vaccine (4 - Booster for Pfizer series) 09/14/2020  ? ? ?Past Medical History:  ?Diagnosis Date  ? ADD (attention deficit disorder)   ? Allergy   ? GERD (gastroesophageal reflux disease)   ? ? ?Past Surgical History:  ?Procedure Laterality Date  ? UPPER GASTROINTESTINAL ENDOSCOPY    ? ? ?Outpatient Medications Prior to Visit  ?Medication Sig Dispense Refill  ? famotidine (PEPCID) 40 MG tablet Take 0.5 tablets (20 mg total) by mouth daily. 90 tablet 3  ? fluticasone (FLONASE) 50 MCG/ACT nasal spray Place into both nostrils daily.    ? omeprazole (PRILOSEC) 10 MG capsule Take 10 mg by mouth as needed.    ? RABEprazole (ACIPHEX) 20 MG tablet Take 1 tablet (20 mg total) by mouth daily. 30  tablet 0  ? ?No facility-administered medications prior to visit.  ? ? ?No Known Allergies ?ROS neg/noncontributory except as noted HPI/below ? ? ?   ?Objective:  ?  ? ?BP 110/77   Pulse 71   Temp 98.7 ?F (37.1 ?C) (Temporal)   Ht '6\' 3"'$  (1.905 m)   Wt 221 lb 2 oz (100.3 kg)   SpO2 97%   BMI 27.64 kg/m?  ?Wt Readings from Last 3 Encounters:  ?11/10/21 221 lb 2 oz (100.3 kg)  ?10/18/21 225 lb (102.1 kg)  ?08/23/21 225 lb 6.4 oz (102.2 kg)  ? ? ?Physical Exam  ? ?Gen: WDWN NAD ?HEENT: NCAT, conjunctiva not injected, sclera nonicteric ?NECK:  supple, no thyromegaly, no nodes, no carotid bruits ?CARDIAC: RRR, S1S2+, no murmur. ?EXT:  no edema ?MSK: no gross abnormalities. MS 5/5 BUE ?Neck -some tenderness w/light spurling ?NEURO: A&O x3.  CN II-XII intact. F-N, RAM, pronator drift neg. ?PSYCH: normal mood. Good eye contact ? ?   ?Assessment & Plan:  ? ?Problem List Items Addressed This Visit   ?None ?Visit Diagnoses   ? ? Other headache syndrome    -  Primary  ? Relevant Orders  ? DG Eye Foreign Body  ? MR Angiogram Head Wo Contrast  ? MR Brain Wo Contrast  ?  DG Cervical Spine Complete  ? ?  ? HA w/exercise-concern w/aneurysm-d/w rad-will do MRI/MRA stat ? ?No orders of the defined types were placed in this encounter. ? ? ?Wellington Hampshire, MD ? ?

## 2021-11-15 ENCOUNTER — Encounter: Payer: Self-pay | Admitting: Family Medicine

## 2022-01-24 ENCOUNTER — Other Ambulatory Visit: Payer: Self-pay | Admitting: *Deleted

## 2022-01-24 DIAGNOSIS — Z111 Encounter for screening for respiratory tuberculosis: Secondary | ICD-10-CM

## 2022-01-26 ENCOUNTER — Other Ambulatory Visit: Payer: No Typology Code available for payment source

## 2022-01-27 ENCOUNTER — Other Ambulatory Visit (INDEPENDENT_AMBULATORY_CARE_PROVIDER_SITE_OTHER): Payer: No Typology Code available for payment source

## 2022-01-27 DIAGNOSIS — Z111 Encounter for screening for respiratory tuberculosis: Secondary | ICD-10-CM | POA: Diagnosis not present

## 2022-01-29 LAB — QUANTIFERON-TB GOLD PLUS
Mitogen-NIL: >10 IU/mL
NIL: 0.1 IU/mL
QuantiFERON-TB Gold Plus: NEGATIVE
TB1-NIL: 0 IU/mL
TB2-NIL: 0.01 IU/mL

## 2022-03-21 ENCOUNTER — Encounter: Payer: Self-pay | Admitting: Family Medicine

## 2022-04-18 ENCOUNTER — Encounter: Payer: Self-pay | Admitting: *Deleted

## 2022-07-07 ENCOUNTER — Encounter: Payer: Self-pay | Admitting: *Deleted

## 2022-07-26 ENCOUNTER — Telehealth: Payer: Self-pay | Admitting: Family Medicine

## 2022-07-26 ENCOUNTER — Other Ambulatory Visit: Payer: Self-pay | Admitting: *Deleted

## 2022-07-26 DIAGNOSIS — Z111 Encounter for screening for respiratory tuberculosis: Secondary | ICD-10-CM

## 2022-07-26 NOTE — Telephone Encounter (Signed)
Order has been placed.

## 2022-07-26 NOTE — Telephone Encounter (Signed)
Pt states they needs an order for a blood TB test for [facility]. We will call him back when order is in to make appt.

## 2022-07-26 NOTE — Telephone Encounter (Signed)
Please advise 

## 2022-07-27 ENCOUNTER — Ambulatory Visit: Payer: 59

## 2022-07-27 ENCOUNTER — Other Ambulatory Visit: Payer: 59

## 2022-07-27 DIAGNOSIS — Z111 Encounter for screening for respiratory tuberculosis: Secondary | ICD-10-CM

## 2022-07-29 LAB — QUANTIFERON-TB GOLD PLUS
Mitogen-NIL: >10 IU/mL
NIL: 0.13 IU/mL
QuantiFERON-TB Gold Plus: NEGATIVE
TB1-NIL: <0 IU/mL
TB2-NIL: 0 IU/mL

## 2022-08-05 ENCOUNTER — Encounter: Payer: No Typology Code available for payment source | Admitting: Family Medicine

## 2022-08-12 ENCOUNTER — Ambulatory Visit (INDEPENDENT_AMBULATORY_CARE_PROVIDER_SITE_OTHER): Payer: 59 | Admitting: Family Medicine

## 2022-08-12 ENCOUNTER — Encounter: Payer: Self-pay | Admitting: Family Medicine

## 2022-08-12 VITALS — BP 102/60 | HR 73 | Temp 97.7°F | Ht 75.0 in | Wt 225.2 lb

## 2022-08-12 DIAGNOSIS — Z0001 Encounter for general adult medical examination with abnormal findings: Secondary | ICD-10-CM

## 2022-08-12 DIAGNOSIS — L219 Seborrheic dermatitis, unspecified: Secondary | ICD-10-CM

## 2022-08-12 DIAGNOSIS — Z0000 Encounter for general adult medical examination without abnormal findings: Secondary | ICD-10-CM

## 2022-08-12 MED ORDER — KETOCONAZOLE 2 % EX SHAM: 1.0000 | MEDICATED_SHAMPOO | CUTANEOUS | 3 refills | Status: AC

## 2022-08-12 MED ORDER — KETOCONAZOLE 2 % EX CREA: 1.0000 | TOPICAL_CREAM | Freq: Two times a day (BID) | CUTANEOUS | 3 refills | Status: AC

## 2022-08-12 NOTE — Patient Instructions (Signed)

## 2022-08-12 NOTE — Progress Notes (Signed)
Phone: [phone]   Subjective:  Patient 37 y.o. male presenting for annual physical.  Chief Complaint  Patient presents with   Annual Exam    CPE Not fasting Concerned about balding and skin irritation   Annual-concerned about balding and skin irrit.  Quit cross fit.  But still exercising.  Plans on 1 more child.  GERD-HH.  +dysphagia-more of a spasm-SL NTG not work. Sees GI.  See problem oriented charting- ROS- ROS: Gen: no fever, chills  Skin: mgpa-balding.  Scalp gets really dry and aches and scabby and in beard patches and nasal area.  ENT: no ear pain, ear drainage, nasal congestion, rhinorrhea, sinus pressure, sore throat Eyes: no blurry vision, double vision Resp: no cough, wheeze,SOB CV: no CP, palpitations, LE edema,  GI: no heartburn, n/v/d/c, abd pain GU: no dysuria, urgency, frequency, hematuria MSK: no joint pain, myalgias, back pain Neuro: no dizziness, headache, weakness, vertigo Psych: no depression, anxiety, insomnia, SI   The following were reviewed and entered/updated in epic: Past Medical History:  Diagnosis Date   ADD (attention deficit disorder)    Allergy    GERD (gastroesophageal reflux disease)    Patient Active Problem List   Diagnosis Date Noted   Attention deficit disorder 04/19/2012   Past Surgical History:  Procedure Laterality Date   UPPER GASTROINTESTINAL ENDOSCOPY      Family History  Problem Relation Age of Onset   Prostate cancer Father    Hypertension Father    Stroke Maternal Grandmother    Heart disease Maternal Grandmother    Hyperlipidemia Maternal Grandfather    Heart disease Maternal Grandfather    Heart disease Paternal Grandmother    Cancer Paternal Grandmother    Diabetes Paternal Grandfather    Hypertension Paternal Grandfather    Heart disease Paternal Grandfather    Colon cancer Neg Hx    Rectal cancer Neg Hx    Stomach cancer Neg Hx     Medications- reviewed and updated Current Outpatient Medications   Medication Sig Dispense Refill   famotidine (PEPCID) 40 MG tablet Take 0.5 tablets (20 mg total) by mouth daily. 90 tablet 3   fluticasone (FLONASE) 50 MCG/ACT nasal spray Place into both nostrils daily.     ketoconazole (NIZORAL) 2 % cream Apply 1 Application topically 2 (two) times daily. 60 g 3   [START ON 08/15/2022] ketoconazole (NIZORAL) 2 % shampoo Apply 1 Application topically 2 (two) times a week. 120 mL 3   omeprazole (PRILOSEC) 10 MG capsule Take 10 mg by mouth as needed.     No current facility-administered medications for this visit.    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 DP 2+B LUNGS: CTAB. No wheezes ABDOMEN:  BS+, soft, NTND, No HSM, no masses EXT:  no edema MSK: no gross abnormalities. MS 5/5 all 4 NEURO: A&O x3.  CN  II-XII intact.  PSYCH: normal mood. Good eye contact     Assessment and Plan   Health Maintenance counseling: 1. Anticipatory guidance: Patient counseled regarding regular dental exams q6 months, eye exams yearly, avoiding smoking and second hand smoke, limiting alcohol to 2 beverages per day.   2. Risk factor reduction:  Advised patient of need for regular exercise and diet rich in fruits and vegetables to reduce risk of heart attack and stroke. Exercise- +.   Wt Readings from Last 3 Encounters:  08/12/22 225 lb 4 oz (102.2 kg)  11/10/21 221 lb 2 oz (100.3 kg)  10/18/21 225 lb (102.1 kg)   3.  Immunizations/screenings/ancillary studies Immunization History  Administered Date(s) Administered   Hepatitis B 04/18/2012, 08/22/2012   Influenza,inj,Quad PF,6+ Mos 05/11/2017, 05/18/2021, 05/08/2022   Influenza-Unspecified 05/25/2016, 05/11/2017, 04/11/2018, 05/14/2019   PFIZER(Purple Top)SARS-COV-2 Vaccination 07/20/2019, 08/09/2019, 07/20/2020   PPD Test 11/26/2014   Tdap 11/26/2014   Health Maintenance Due  Topic Date Due   Hepatitis C Screening  Never done    4. Skin cancer screening- Iadvised regular sunscreen use. Denies worrisome, changing, or new skin lesions.  5. Smoking associated screening: non smoker  6. STD screening - n/a   Problem List Items Addressed This Visit   None Visit Diagnoses     Wellness examination    -  Primary   Relevant Orders   Comprehensive metabolic panel   Lipid panel   TSH   CBC with Differential/Platelet   Seborrheic dermatitis           Recommended follow up: annualReturn in about 1 year (around 08/13/2023) for annual. No future appointments.   Lab/Order associations:not fasting   ICD-10-CM   1. Wellness examination  Z00.00 Comprehensive metabolic panel    Lipid panel    TSH    CBC with Differential/Platelet    2. Seborrheic dermatitis  L21.9      Wellness-anticipatory guidance.  Work on [employer].  Check CBC,CMP,lipids,TSH, A1C.  F/u 1 yr  Seb derm-ketoconazole shampoo twice weekly and cream prn  Meds ordered this encounter  Medications   ketoconazole (NIZORAL) 2 % cream    Sig: Apply 1 Application topically 2 (two) times daily.    Dispense:  60 g    Refill:  3   ketoconazole (NIZORAL) 2 % shampoo    Sig: Apply 1 Application topically 2 (two) times a week.    Dispense:  120 mL    Refill:  3     Wellington Hampshire, MD

## 2023-02-17 ENCOUNTER — Other Ambulatory Visit (INDEPENDENT_AMBULATORY_CARE_PROVIDER_SITE_OTHER): Payer: 59

## 2023-02-17 ENCOUNTER — Other Ambulatory Visit: Payer: Self-pay | Admitting: *Deleted

## 2023-02-17 ENCOUNTER — Telehealth: Payer: Self-pay | Admitting: Family Medicine

## 2023-02-17 DIAGNOSIS — Z111 Encounter for screening for respiratory tuberculosis: Secondary | ICD-10-CM | POA: Diagnosis not present

## 2023-02-17 NOTE — Telephone Encounter (Signed)
Spoke with patient notified TB blood work was done in 07/2022  Patient stated he need one new test done due to his job requiring results with in 30 days  Future Order placed

## 2023-02-17 NOTE — Telephone Encounter (Signed)
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 Patient also states he still needs to do CPE fasting labs which were due in January. Can we get all of these labs ordered as well? Please Advise.

## 2023-03-14 ENCOUNTER — Telehealth: Payer: Self-pay | Admitting: Gastroenterology

## 2023-03-14 NOTE — Telephone Encounter (Signed)
Needs an OV first

## 2023-03-14 NOTE — Telephone Encounter (Signed)
Received a MyChart message from patient stating he was ready to schedule a dilation.  Does he need an OV with the doctor or can he have a nurse PV appt & just schedule the EGD directly?  Please advise.  Thank you.

## 2023-05-14 IMAGING — MR MR MRA HEAD W/O CM
1 series · 19 of 48 positions shown · non-contrast
Comparison: No pertinent prior exam.

CLINICAL DATA: Positional headache

EXAM:
MRI HEAD WITHOUT CONTRAST
MRA HEAD WITHOUT CONTRAST
TECHNIQUE: Multiplanar, multi-echo pulse sequences of the brain and surrounding
structures were acquired without intravenous contrast. Angiographic
images of the Circle of Willis were acquired using MRA technique
without intravenous contrast.

[Series 1: TOF · axial · 0.6mm · 0.35mm/px · z∈[-72,+26]mm · 19 of 170 slices shown]
[im 1/170]
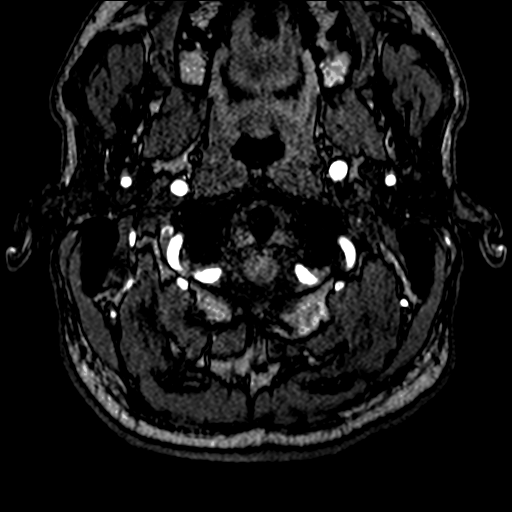
[im 4/170]
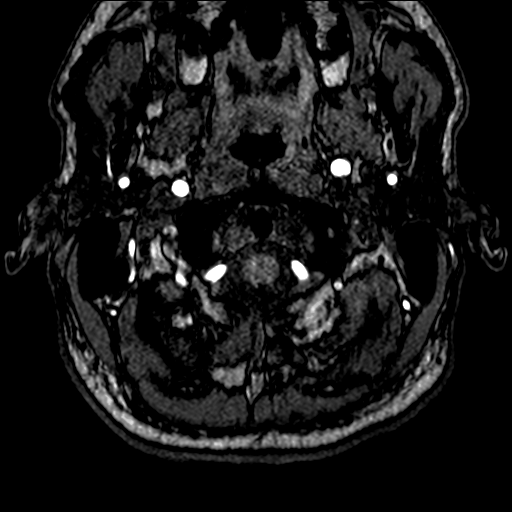
[im 8/170]
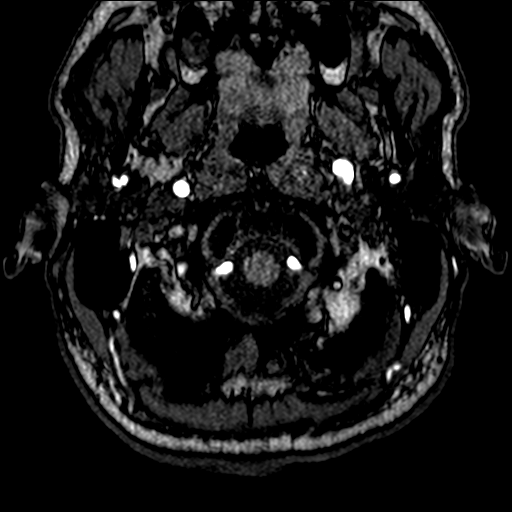
[im 11/170]
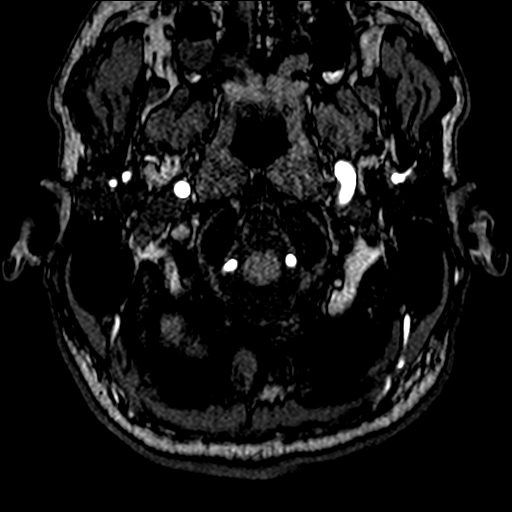
[im 15/170]
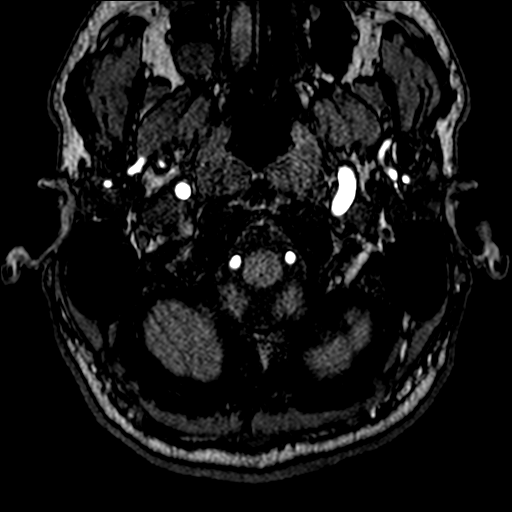
[im 18/170]
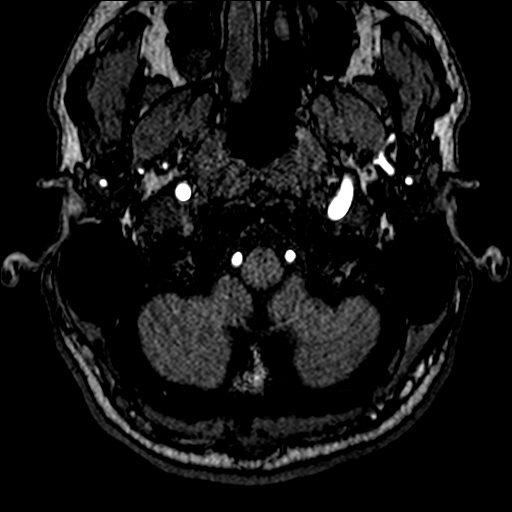
[im 22/170]
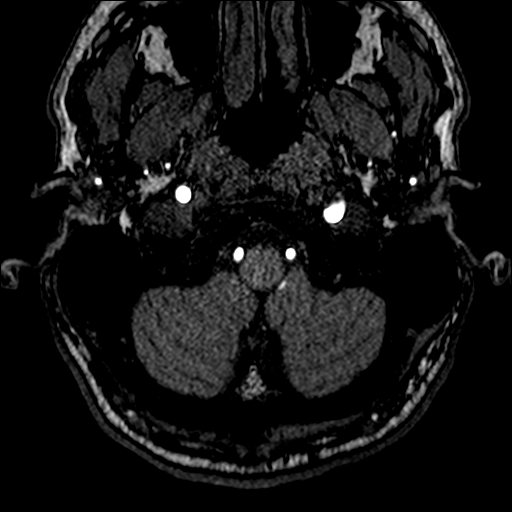
[im 26/170]
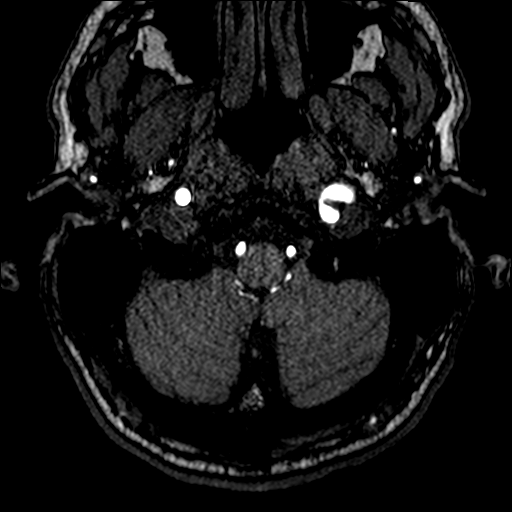
[im 29/170]
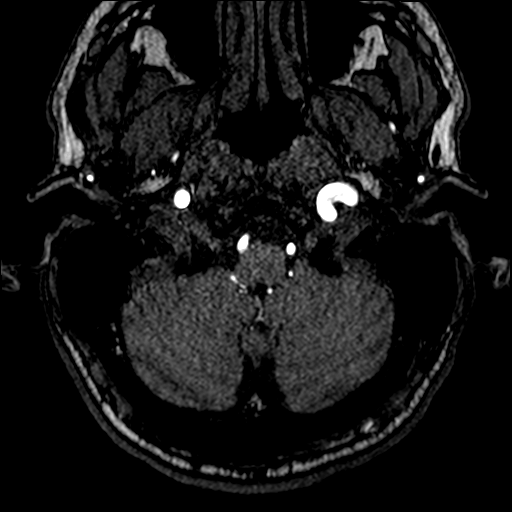
[im 33/170]
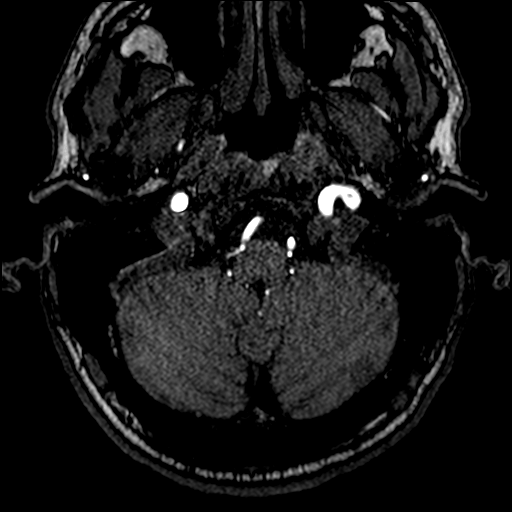
[im 36/170]
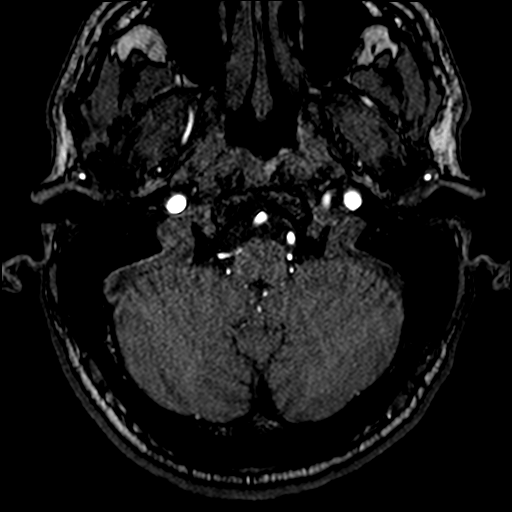
[im 54/170]
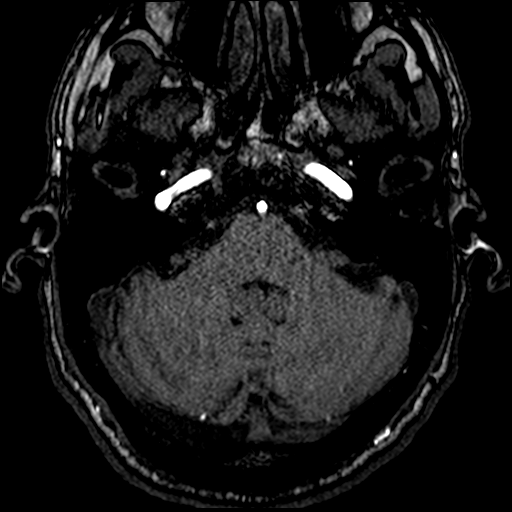
[im 76/170]
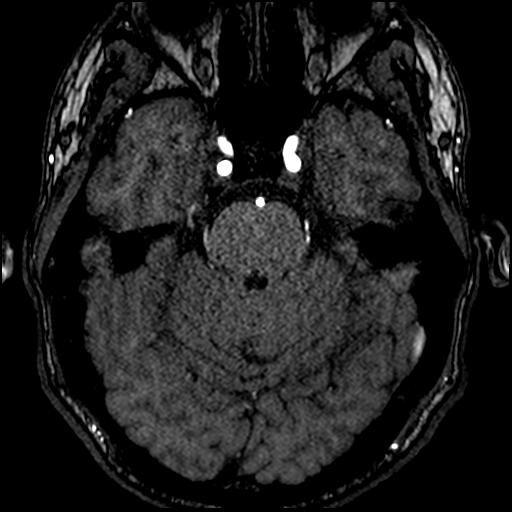
[im 87/170]
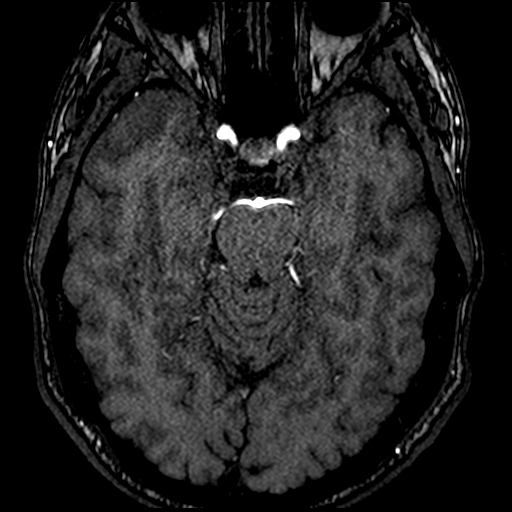
[im 98/170]
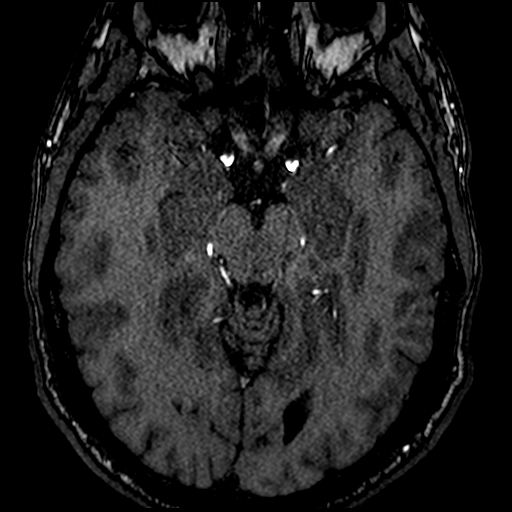
[im 119/170]
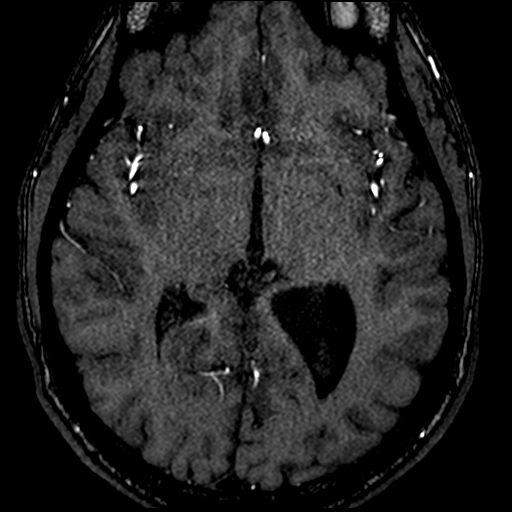
[im 141/170]
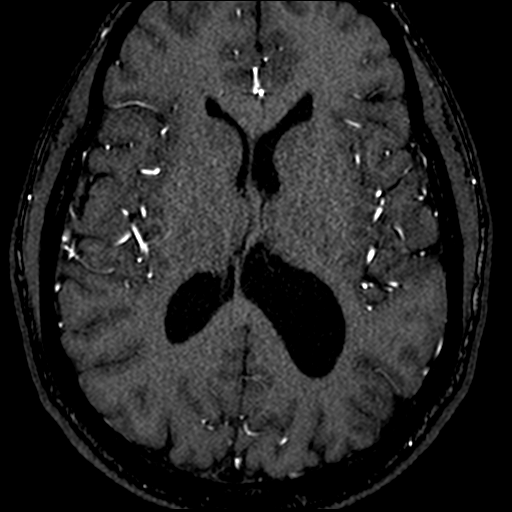
[im 144/170]
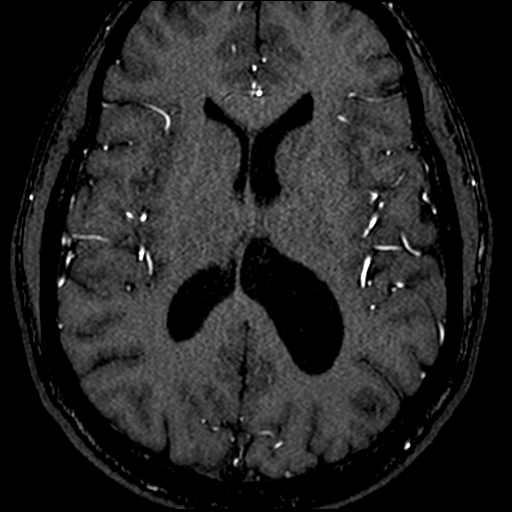
[im 162/170]
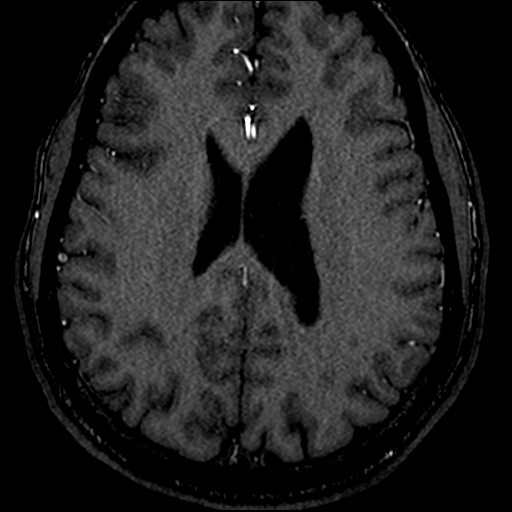

[19 of 48 positions shown; findings below may reference images not displayed]

FINDINGS: MRI HEAD FINDINGS

Brain: No acute infarct, mass effect or extra-axial collection. No
acute or chronic hemorrhage. Normal white matter signal, parenchymal
volume and CSF spaces. The midline structures are normal.

Vascular: Major flow voids are preserved.

Skull and upper cervical spine: Normal calvarium and skull base.
Visualized upper cervical spine and soft tissues are normal.

Sinuses/Orbits:No paranasal sinus fluid levels or advanced mucosal
thickening. No mastoid or middle ear effusion. Normal orbits.

MRA HEAD FINDINGS

POSTERIOR CIRCULATION:

--Vertebral arteries: Normal

--Inferior cerebellar arteries: Normal.

--Basilar artery: Normal.

--Superior cerebellar arteries: Normal.

--Posterior cerebral arteries: Normal.

ANTERIOR CIRCULATION:

--Intracranial internal carotid arteries: Normal.

--Anterior cerebral arteries (ACA): Normal.

--Middle cerebral arteries (MCA): Normal.

ANATOMIC VARIANTS: None
IMPRESSION: Normal MRI/MRA of the brain.

## 2023-05-14 IMAGING — DX DG CERVICAL SPINE COMPLETE 4+V
5 series · 5 of 5 positions shown · non-contrast
Comparison: None.

CLINICAL DATA: Shoulder and neck pain with headaches related to a
lifting weight injury.

EXAM:
CERVICAL SPINE - COMPLETE 4+ VIEW

[c-spine lat]
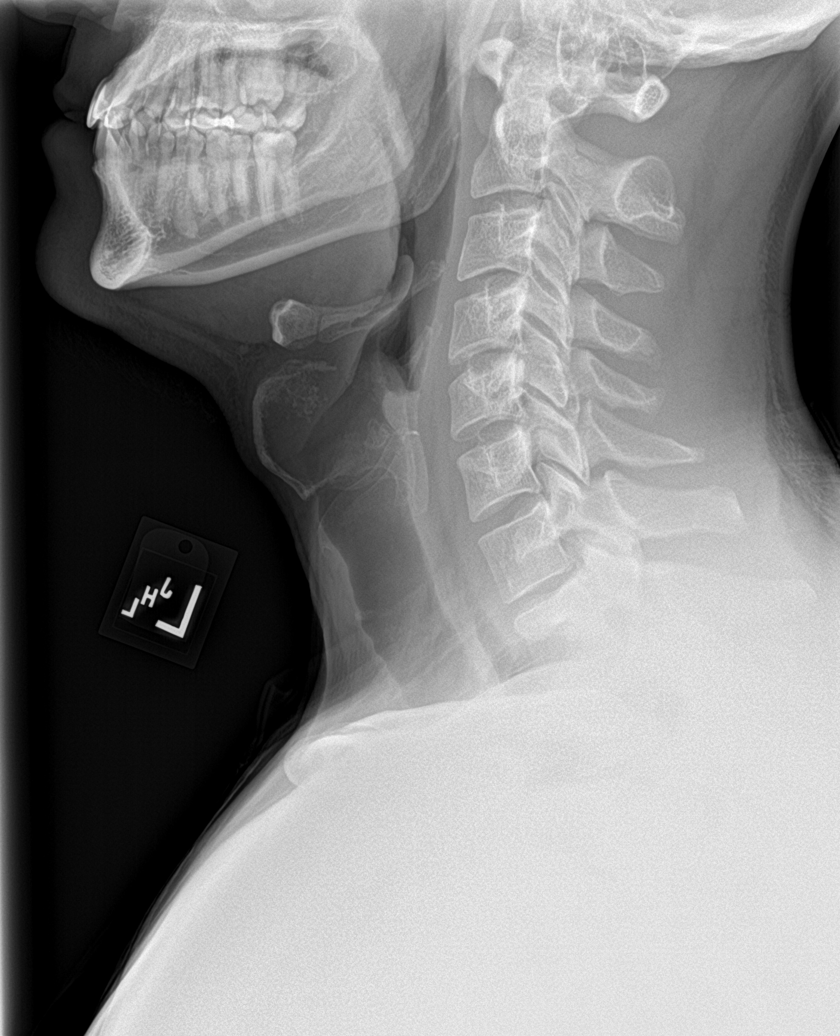

[c-spine obl (1 of 2)]
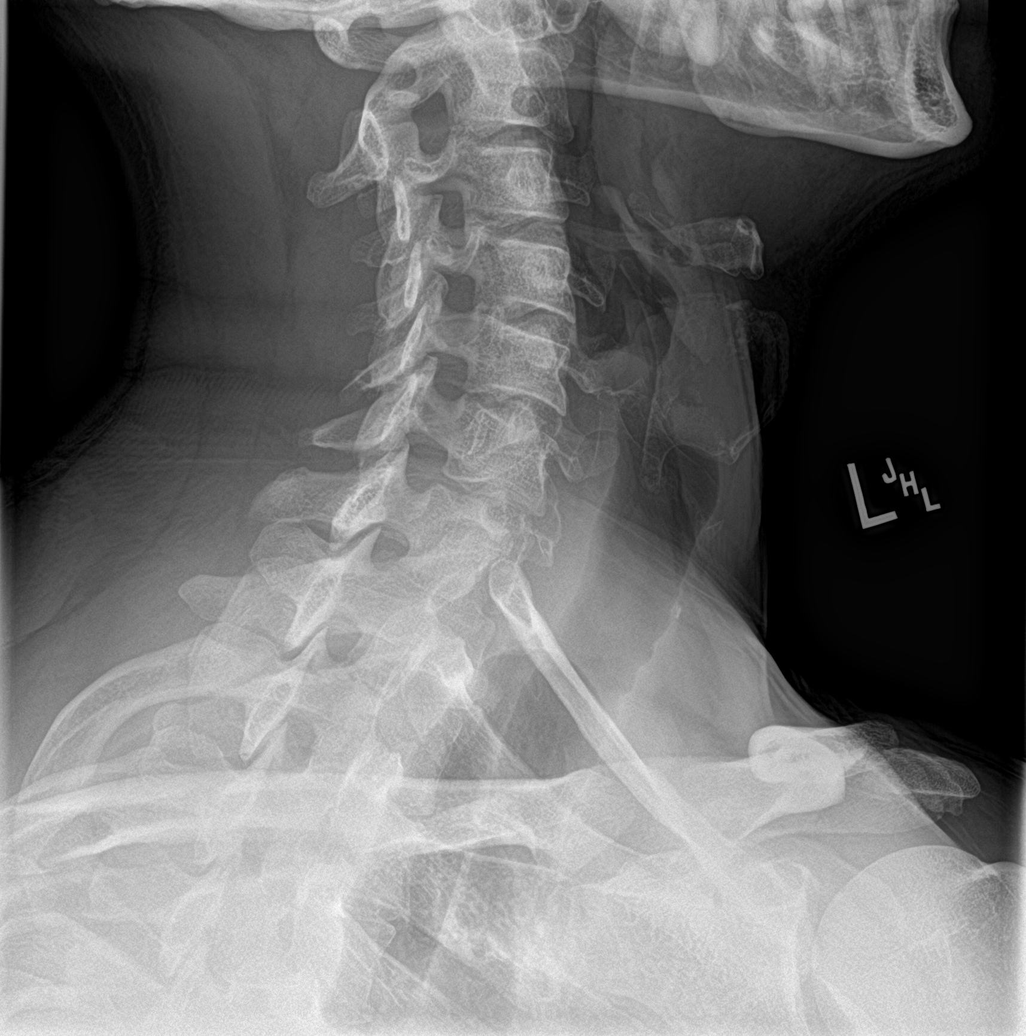

[c-spine obl (2 of 2)]
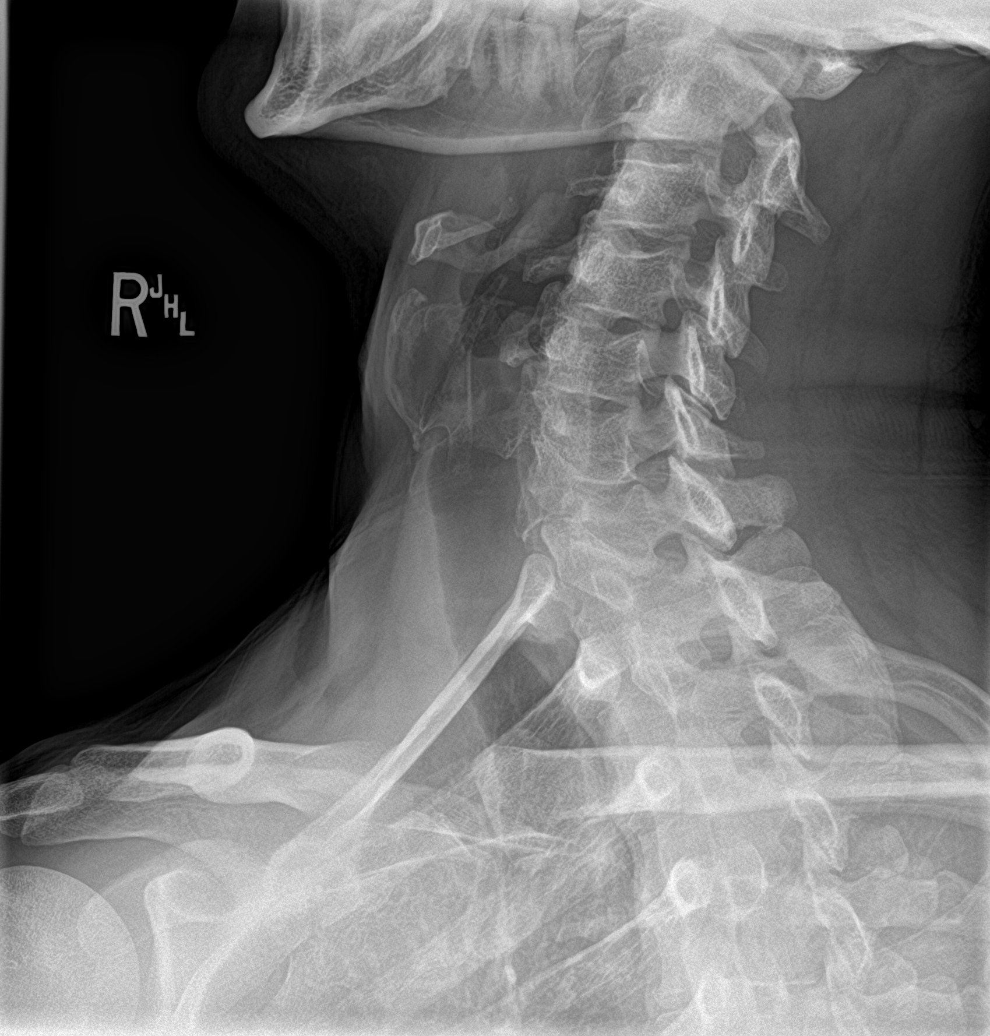

[c-spine ap]
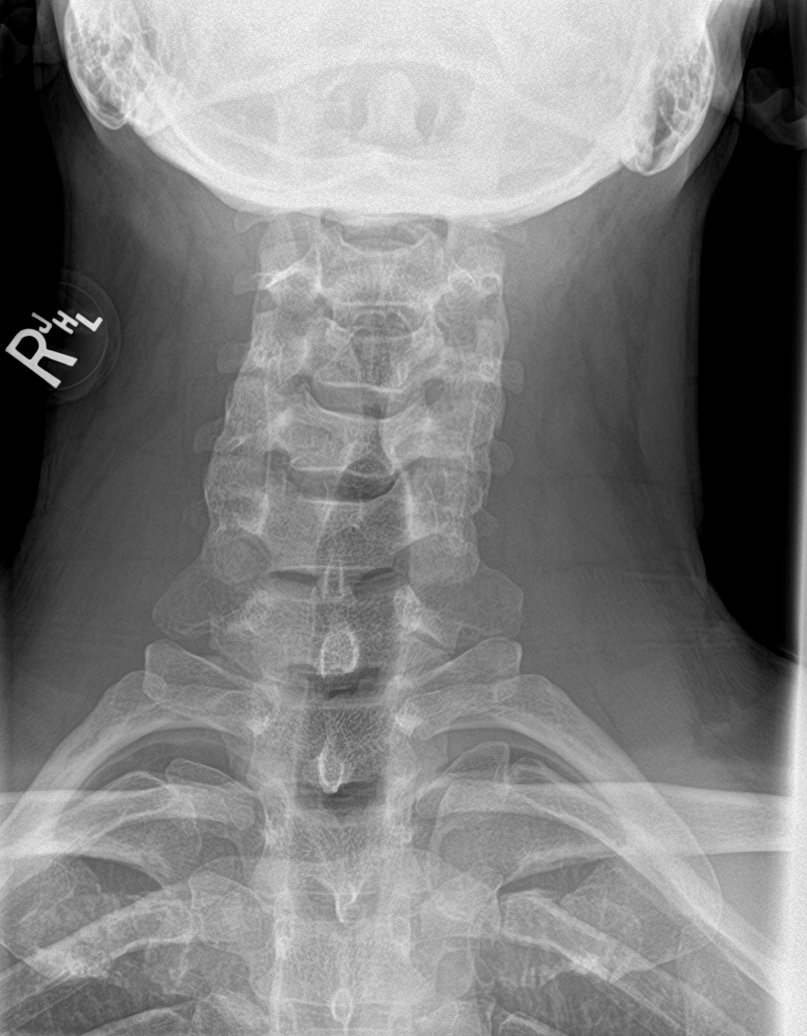

[c-spine open mouth]
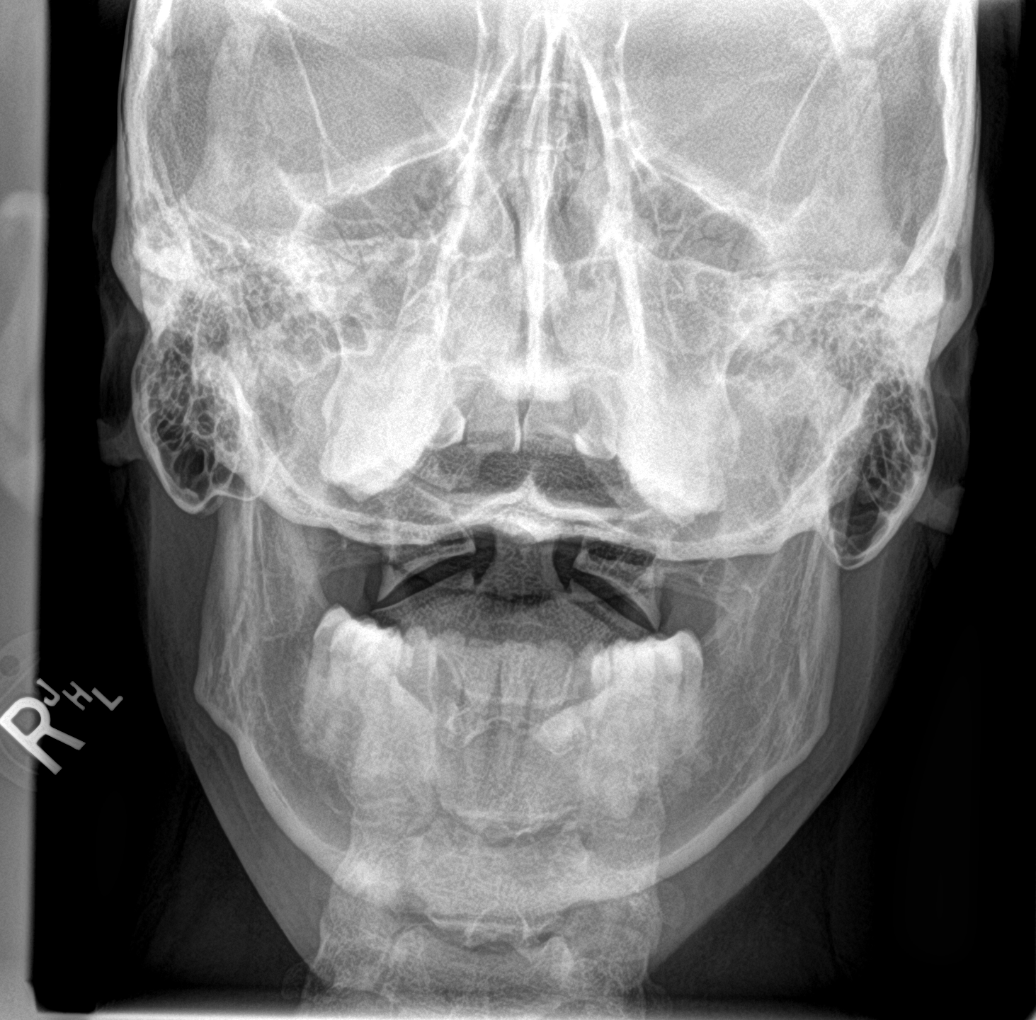

[5 of 5 positions shown; findings below may reference images not displayed]

FINDINGS: There is no evidence of cervical spine fracture or prevertebral soft
tissue swelling. Alignment is normal. No other significant bone
abnormalities are identified.
IMPRESSION: Negative cervical spine radiographs.

## 2023-05-29 ENCOUNTER — Other Ambulatory Visit (INDEPENDENT_AMBULATORY_CARE_PROVIDER_SITE_OTHER): Payer: 59

## 2023-05-29 ENCOUNTER — Encounter: Payer: Self-pay | Admitting: Gastroenterology

## 2023-05-29 ENCOUNTER — Ambulatory Visit (INDEPENDENT_AMBULATORY_CARE_PROVIDER_SITE_OTHER): Payer: 59 | Admitting: Gastroenterology

## 2023-05-29 VITALS — BP 118/80 | HR 69 | Ht 75.0 in | Wt 230.5 lb

## 2023-05-29 DIAGNOSIS — K2100 Gastro-esophageal reflux disease with esophagitis, without bleeding: Secondary | ICD-10-CM

## 2023-05-29 DIAGNOSIS — R079 Chest pain, unspecified: Secondary | ICD-10-CM

## 2023-05-29 DIAGNOSIS — R1319 Other dysphagia: Secondary | ICD-10-CM

## 2023-05-29 LAB — BASIC METABOLIC PANEL
BUN: 17 mg/dL (ref 6–23)
CO2: 29 meq/L (ref 19–32)
Calcium: 9.8 mg/dL (ref 8.4–10.5)
Chloride: 102 meq/L (ref 96–112)
Creatinine, Ser: 1.22 mg/dL (ref 0.40–1.50)
GFR: 75.49 mL/min (ref >60.00)
Glucose, Bld: 103 mg/dL — ABNORMAL HIGH (ref 70–99)
Potassium: 4.1 meq/L (ref 3.5–5.1)
Sodium: 139 meq/L (ref 135–145)

## 2023-05-29 LAB — MAGNESIUM: Magnesium: 1.9 mg/dL (ref 1.5–2.5)

## 2023-05-29 NOTE — Progress Notes (Signed)
Discussed the use of AI scribe software for clinical note transcription with the patient, who gave verbal consent to proceed.  HPI : Jeremy Moran is a 37 y.o. male with a history of dysphagia attributed to GERD/reflux esophagitis presents for follow up.  I initially saw the patient in January 2023.  At that time, he had a longstanding history of intermittent solid dysphagia.  He had previously had an upper endoscopy in 2014 by [facility], at which mild reflux esophagitis was noted, but no stricture was noted and no dilation was performed and no eosphageal biopsies obtained.  I repeated an EGD in March 2023 and again noted LA grade A reflux esophagitis, but no esophageal stricture or stenosis.  Esophageal biopsies were notable for increased eosinophils in the distal esophagus, but no eosinophils in the proximal esophagus.  Given the endoscopic appearance and the distal distribution of his eosinophilia, it was felt that his eosinophils were more likely to be related to reflux esophagitis rather than eosinophilic esophagitis.  He was recommended to continue his Aciphex for reflux esophagitis.  He reports that he did well for a while, but in July experienced a severe episode of food impaction during a business dinner after consuming octopus. The food remained lodged overnight, causing significant discomfort and intolerance of oral secretions. The patient reports that the impaction eventually resolved with the consumption of a carbonated beverage, but the esophagus remained sore for some time after the episode.  He admits that he had not been taking his PPI regularly for a while leading up to that episode.  Since the episode in July, the patient has been using carbonated beverages to manage symptoms, which has been effective. The patient also reports occasional episodes of food feeling "locked up," but has been more proactive in managing these episodes. The patient admits to having been lax with the  prescribed acid-reducing medication, Aciphex, prior to the July episode, but has since been more compliant with medication use.  The patient also reports experiencing occasional, non-exertional, spasmodic chest pain, which is not associated with eating or any other identifiable triggers. The pain is brief and resolves spontaneously. The patient has been managing their condition with over-the-counter omeprazole and has expressed willingness to continue daily medication use to prevent further complications.    EGD March 2023 Indication: Dysphagia LA grade a esophagitis, otherwise normal esophagus, biopsied Normal stomach Normal duodenum  1. Surgical [P], distal esophagus EOSINOPHIL-RICH ESOPHAGITIS (SEE COMMENT) NEGATIVE FOR GLANDULAR EPITHELIUM, DYSPLASIA AND CARCINOMA 2. Surgical [P], proximal esophagus REACTIVE SQUAMOUS MUCOSA (NEGATIVE FOR EOSINOPHILS NEGATIVE FOR GLANDULAR EPITHELIUM, DYSPLASIA AND CARCINOMA   Past Medical History:  Diagnosis Date   ADD (attention deficit disorder)    Allergy    GERD (gastroesophageal reflux disease)      Past Surgical History:  Procedure Laterality Date   UPPER GASTROINTESTINAL ENDOSCOPY     Family History  Problem Relation Age of Onset   Prostate cancer Father    Hypertension Father    Stroke Maternal Grandmother    Heart disease Maternal Grandmother    Hyperlipidemia Maternal Grandfather    Heart disease Maternal Grandfather    Heart disease Paternal Grandmother    Cancer Paternal Grandmother    Diabetes Paternal Grandfather    Hypertension Paternal Grandfather    Heart disease Paternal Grandfather    Colon cancer Neg Hx    Rectal cancer Neg Hx    Stomach cancer Neg Hx    Social History   Tobacco Use   Smoking  status: Never   Smokeless tobacco: Never  Vaping Use   Vaping status: Never Used  Substance Use Topics   Alcohol use: Yes    Comment: rarely   Drug use: No   Current Outpatient Medications  Medication Sig  Dispense Refill   famotidine (PEPCID) 40 MG tablet Take 0.5 tablets (20 mg total) by mouth daily. 90 tablet 3   fluticasone (FLONASE) 50 MCG/ACT nasal spray Place into both nostrils daily.     ketoconazole (NIZORAL) 2 % cream Apply 1 Application topically 2 (two) times daily. 60 g 3   ketoconazole (NIZORAL) 2 % shampoo Apply 1 Application topically 2 (two) times a week. 120 mL 3   omeprazole (PRILOSEC) 10 MG capsule Take 10 mg by mouth as needed.     No current facility-administered medications for this visit.   No Known Allergies   Review of Systems: All systems reviewed and negative except where noted in HPI.    No results found.  Physical Exam: BP 118/80 (BP Location: Left Arm, Patient Position: Sitting, Cuff Size: Normal)   Pulse 69   Ht 6\' 3"  (1.905 m)   Wt 230 lb 8 oz (104.6 kg)   SpO2 97%   BMI 28.81 kg/m  Constitutional: Pleasant,well-developed, Caucasian male in no acute distress. Neurological: Alert and oriented to person place and time. Skin: Skin is warm and dry. No rashes noted. Psychiatric: Normal mood and affect. Behavior is normal.  CBC    Component Value Date/Time   WBC 5.7 08/04/2021 1127   RBC 4.98 08/04/2021 1127   HGB 15.3 08/04/2021 1127   HGB 14.6 06/30/2016 1630   HCT 44.3 08/04/2021 1127   HCT 40.1 06/30/2016 1630   PLT 152.0 08/04/2021 1127   PLT 143 (L) 06/30/2016 1630   MCV 88.9 08/04/2021 1127   MCV 88 06/30/2016 1630   MCH 31.9 06/30/2016 1630   MCH 31.7 04/18/2012 1656   MCHC 34.7 08/04/2021 1127   RDW 13.1 08/04/2021 1127   RDW 13.8 06/30/2016 1630   LYMPHSABS 1.7 06/30/2016 1630   MONOABS 0.5 04/18/2012 1656   EOSABS 0.1 06/30/2016 1630   BASOSABS 0.0 06/30/2016 1630    CMP     Component Value Date/Time   NA 137 08/04/2021 1127   NA 141 01/22/2019 1629   K 4.4 08/04/2021 1127   CL 102 08/04/2021 1127   CO2 30 08/04/2021 1127   GLUCOSE 89 08/04/2021 1127   BUN 22 08/04/2021 1127   BUN 16 01/22/2019 1629   CREATININE  1.16 08/04/2021 1127   CREATININE 1.10 04/18/2012 1656   CALCIUM 9.6 08/04/2021 1127   PROT 7.1 08/04/2021 1127   PROT 6.5 01/22/2019 1629   ALBUMIN 4.7 08/04/2021 1127   ALBUMIN 4.7 01/22/2019 1629   AST 22 08/04/2021 1127   ALT 33 08/04/2021 1127   ALKPHOS 61 08/04/2021 1127   BILITOT 0.8 08/04/2021 1127   BILITOT 0.9 01/22/2019 1629   GFRNONAA 95 01/22/2019 1629   GFRAA 110 01/22/2019 1629       Latest Ref Rng & Units 08/04/2021   11:27 AM 06/30/2016    4:30 PM 04/18/2012    4:56 PM  CBC EXTENDED  WBC 4.0 - 10.5 K/uL 5.7  7.0  4.7   RBC 4.22 - 5.81 Mil/uL 4.98  4.57  4.79   Hemoglobin 13.0 - 17.0 g/dL 36.6  44.0  34.7   HCT 39.0 - 52.0 % 44.3  40.1  41.3   Platelets 150.0 - 400.0 K/uL 152.0  143  150   NEUT# 1.4 - 7.0 x10E3/uL  4.4  2.3   Lymph# 0.7 - 3.1 x10E3/uL  1.7  1.6       ASSESSMENT AND PLAN:      Dysphagia, suspect esophageal Stricture Long-standing dysphagia and recent transient food impaction. Previous endoscopy in March showed distal esophageal inflammation and elevated eosinophils, likely due to acid reflux. Severe episode in July resolved with carbonated beverage. Inconsistent acid-reducing medication use leading to recurrent symptoms. Discussed minimal risks of dilation (perforation, bleeding) and benefits (symptom relief, complication prevention). Patient prefers dilation. - Schedule EGD with dilation - Repeat biopsies to reassess eosinophilic esophagitis - Advise small bites and careful eating  Gastroesophageal Reflux Disease (GERD) Chronic GERD with esophageal inflammation and stricture. He does not have typical symptoms of heartburn or acid regurgitation.  Inconsistent medication use leading to recurrent symptoms. Discussed long-term PPI benefits (symptom control, complication prevention) and risks (decreased bone density, increased GI infections, low magnesium, kidney problems). Benefits outweigh risks. Patient agrees to daily medication. Discussed  anti-reflux surgery as an alternative. - Continue omeprazole daily - Consider alternating omeprazole with famotidine to minimize long-term PPI use - Monitor kidney function and serum magnesium levels periodically (check today)  Chest Pain Intermittent, non-exertional chest pain possibly due to esophageal spasm or reflux. Symptoms are infrequent and short-lived. Differential includes esophageal spasm and reflux-related pain. - Monitor symptoms - Consider esophageal manometry if symptoms persist or worsen  The details, risks (including bleeding, perforation, infection, missed lesions, medication reactions and possible hospitalization or surgery if complications occur), benefits, and alternatives to EGD with possible biopsy and possible dilation were discussed with the patient and he consents to proceed.     Cherrise Occhipinti E. Tomasa Rand, MD [facility]  I spent a total of 28 minutes reviewing the patient's medical record, interviewing and examining the patient, discussing his diagnosis and management of his condition going forward, and documenting in the medical record    Jeani Sow, MD

## 2023-05-29 NOTE — Patient Instructions (Addendum)
Your provider has requested that you go to the basement level for lab work before leaving today. Press "B" on the elevator. The lab is located at the first door on the left as you exit the elevator.   Continue Omeprazole daily.   You have been scheduled for an endoscopy. Please follow written instructions given to you at your visit today.  If you use inhalers (even only as needed), please bring them with you on the day of your procedure.  If you take any of the following medications, they will need to be adjusted prior to your procedure:   DO NOT TAKE 7 DAYS PRIOR TO TEST- Trulicity (dulaglutide) Ozempic, Wegovy (semaglutide) Mounjaro (tirzepatide) Bydureon Bcise (exanatide extended release)  DO NOT TAKE 1 DAY PRIOR TO YOUR TEST Rybelsus (semaglutide) Adlyxin (lixisenatide) Victoza (liraglutide) Byetta (exanatide)   If your blood pressure at your visit was 140/90 or greater, please contact your primary care physician to follow up on this.  _______________________________________________________  If you are age 38 or older, your body mass index should be between 23-30. Your Body mass index is 28.81 kg/m. If this is out of the aforementioned range listed, please consider follow up with your Primary Care Provider.  If you are age 38 or younger, your body mass index should be between 19-25. Your Body mass index is 28.81 kg/m. If this is out of the aformentioned range listed, please consider follow up with your Primary Care Provider.   ________________________________________________________  The [facility] [facility] providers would like to encourage you to use [facility] to communicate with providers for non-urgent requests or questions.  Due to long hold times on the telephone, sending your provider a message by [facility] may be a faster and more efficient way to get a response.  Please allow 48 business hours for a response.  Please remember that this is for non-urgent requests.   _______________________________________________________  Due to recent changes in healthcare laws, you may see the results of your imaging and laboratory studies on MyChart before your provider has had a chance to review them.  We understand that in some cases there may be results that are confusing or concerning to you. Not all laboratory results come back in the same time frame and the provider may be waiting for multiple results in order to interpret others.  Please give [place] 48 hours in order for your provider to thoroughly review all the results before contacting the office for clarification of your results.    Thank you for choosing me and [facility].  Dr. Tiajuana Amass

## 2023-06-01 NOTE — Progress Notes (Signed)
Jeremy Moran, Your blood test look great.  Normal kidney function, normal magnesium levels.  I recommend you monitor this every 1 to 2 years that you are taking a proton pump inhibitor.

## 2023-06-12 ENCOUNTER — Encounter: Payer: Self-pay | Admitting: Gastroenterology

## 2023-06-29 ENCOUNTER — Ambulatory Visit: Payer: 59 | Admitting: Gastroenterology

## 2023-06-29 ENCOUNTER — Encounter: Payer: Self-pay | Admitting: Gastroenterology

## 2023-06-29 VITALS — BP 113/71 | HR 66 | Temp 98.0°F | Resp 11 | Ht 75.0 in | Wt 230.0 lb

## 2023-06-29 DIAGNOSIS — K2100 Gastro-esophageal reflux disease with esophagitis, without bleeding: Secondary | ICD-10-CM

## 2023-06-29 DIAGNOSIS — K222 Esophageal obstruction: Secondary | ICD-10-CM | POA: Diagnosis not present

## 2023-06-29 DIAGNOSIS — R1319 Other dysphagia: Secondary | ICD-10-CM

## 2023-06-29 DIAGNOSIS — R1310 Dysphagia, unspecified: Secondary | ICD-10-CM | POA: Diagnosis not present

## 2023-06-29 MED ORDER — SODIUM CHLORIDE 0.9 % IV SOLN: 500.0000 mL | INTRAVENOUS | Status: AC

## 2023-06-29 NOTE — Patient Instructions (Addendum)
Recommendation:  - Patient has a contact number available for emergencies.  - The signs and symptoms of potential delayed complications were discussed with the patient. Return to normal activities tomorrow. Written discharge instructions were provided to the patient.  - Full liquid diet today. Advance tomorrow if tolerating without pain.  - Continue present medications. - Await pathology results.   YOU HAD AN ENDOSCOPIC PROCEDURE TODAY AT THE [facility]:   Refer to the procedure report that was given to you for any specific questions about what was found during the examination.  If the procedure report does not answer your questions, please call your gastroenterologist to clarify.  If you requested that your care partner not be given the details of your procedure findings, then the procedure report has been included in a sealed envelope for you to review at your convenience later.  YOU SHOULD EXPECT: Some feelings of bloating in the abdomen. Passage of more gas than usual.  Walking can help get rid of the air that was put into your GI tract during the procedure and reduce the bloating. If you had a lower endoscopy (such as a colonoscopy or flexible sigmoidoscopy) you may notice spotting of blood in your stool or on the toilet paper. If you underwent a bowel prep for your procedure, you may not have a normal bowel movement for a few days.  Please Note:  You might notice some irritation and congestion in your nose or some drainage.  This is from the oxygen used during your procedure.  There is no need for concern and it should clear up in a day or so.  SYMPTOMS TO REPORT IMMEDIATELY:   Following upper endoscopy (EGD)  Vomiting of blood or coffee ground material  New chest pain or pain under the shoulder blades  Painful or persistently difficult swallowing  New shortness of breath  Fever of 100F or higher  Black, tarry-looking stools  For urgent or emergent issues, a  gastroenterologist can be reached at any hour by calling [phone]. Do not use MyChart messaging for urgent concerns.    DIET:  Nothing to eat or drink until 11:00am, then you may have a FULL LIQUID DIET for there remainder of today and advance to your regular diet tomorrow as tolerated if you are without pain. Drink plenty of fluids but you should avoid alcoholic beverages for 24 hours.  MEDICATIONS: Continue present medications.  Please see handouts given to you by your recovery nurse today: Esophageal stricture, Full Liquid Diet.  FOLLOW UP: Await pathology results.  Thank you for allowing [place] to provide for your healthcare needs today.  ACTIVITY:  You should plan to take it easy for the rest of today and you should NOT DRIVE or use heavy machinery until tomorrow (because of the sedation medicines used during the test).    FOLLOW UP: Our staff will call the number listed on your records the next business day following your procedure.  We will call around 7:15- 8:00 am to check on you and address any questions or concerns that you may have regarding the information given to you following your procedure. If we do not reach you, we will leave a message.     If any biopsies were taken you will be contacted by phone or by letter within the next 1-3 weeks.  Please call us at [phone] if you have not heard about the biopsies in 3 weeks.    SIGNATURES/CONFIDENTIALITY: You and/or your care partner have  signed paperwork which will be entered into your electronic medical record.  These signatures attest to the fact that that the information above on your After Visit Summary has been reviewed and is understood.  Full responsibility of the confidentiality of this discharge information lies with you and/or your care-partner.    FULL LIQUID DIET:  A full liquid diet is when you eat or drink only liquids and foods that become liquids at room temperature. This diet should only be used for a  short period of time to help you get better from an illness or surgery. You'll be told when it's safe to eat a normal diet again. What are tips for following this plan? Reading food labels Check the food labels on nutrition shakes. Look for shakes with 8-10 grams of protein in each serving. Choose drinks, such as milks and juices, that say they're "fortified" or "enriched." This means they have vitamins and minerals added to them. Shopping Buy pre-made nutrition shakes to keep on hand. Buy different flavors of milks and shakes. Meal planning Choose flavors and foods that you like. To make sure you get enough energy and calories from food: Have three full liquid meals each day. Have a liquid snack between each meal. Drink 6-8 oz (177-237 mL) of a supplement shake with meals or as snacks. Add protein powder, powdered milk, milk, or yogurt to shakes. This can help you get enough protein in your diet. Drink at least one serving a day of citrus fruit juice or fruit juice that has vitamin C added to it. General guidelines Before starting the full liquid diet, ask your health care provider what foods you should avoid. These may include full-fat or high-fiber liquids. You may have any liquid or food that becomes a liquid at room temperature. This includes any foods that can be poured off a spoon at room temperature. Do not drink alcohol unless your provider says it's okay. This diet gives you most of the nutrients you need for energy. But you may not get enough vitamins, minerals, and fiber. Make sure to talk to your provider or an expert in healthy eating called a dietitian about: How many calories you need to eat each day. How much fluid you should have each day. Taking a vitamin or supplement. What foods should I eat?  Fruits Fruit juice without pulp. Strained fruit pures, with seeds and skins removed. Vegetables Pulp-free tomato or vegetable juice. Vegetables pured in soup. Grains Thin,  hot cereal, such as farina. Soft-cooked pasta or rice pured in soup. Meats and other proteins Beef, chicken, and fish broths. Protein supplements that come as a powder. Dairy Milk and milk-based drinks, such as milk shakes and instant breakfast mixes. Smooth yogurt. Pured cottage cheese. Fats and oils Melted margarine and butter. Cream. [place]ola, almond, avocado, corn, grapeseed, sunflower, and sesame oils. Gravy. Beverages Water. Coffee and tea, with or without caffeine. Cocoa. Liquid supplements. Soft drinks. Nondairy milks, such as almond, coconut, rice, or soy milk. Sweets and desserts Custard. Pudding. Flavored gelatin. Smooth ice cream, without nuts or candy pieces. Sherbet. Frozen ice pops. [place] ice. Pudding pops. Seasonings and condiments Salt and pepper. Spices. Vinegar. Ketchup. Yellow mustard. Smooth sauces, such as [place], cheese sauce, or white sauce. Soy sauce. Syrup. Honey. Jelly, without fruit pieces. Other foods Cocoa powder. Cream soups. Strained soups. The items listed above may not be all the foods and drinks you can have. Talk to a dietitian to learn more. What foods should I avoid? Fruits  All whole fresh, frozen, or canned fruits. Vegetables All whole fresh, frozen, or canned vegetables. Grains Whole grains. Pasta. Rice. Cold cereal. Bread. Crackers. Meats and other proteins All cuts of meat, poultry, and fish. Eggs. Tofu and soy protein. Nuts and nut butters. Precooked or cured meat, such as sausages or meat loaves. Dairy Hard cheese. Yogurt with fruit chunks. Fats and oils Coconut oil. Palm oil. Lard. Cold butter. Sweets and desserts Ice cream or other frozen desserts that contain solids, such as nuts, chocolate chips, and pieces of cookies. Cakes. Cookies. Candy. Seasonings and condiments Stone-ground mustard. Other foods Soups with chunks or pieces. The items listed above may not be all the foods and drinks you should avoid. Talk to a dietitian to  learn more. This information is not intended to replace advice given to you by your health care provider. Make sure you discuss any questions you have with your health care provider. Document Revised: 09/13/2022 Document Reviewed: 09/13/2022 Elsevier Patient Education  2024 [employer].

## 2023-06-29 NOTE — Progress Notes (Signed)
Patient complaints of left upper chest pain, states started last night, pain is a 2 out of 10, doesn't radiate, is a dull ache, and is constant. Tim, CRNA informed and states he will come and see the patient.

## 2023-06-29 NOTE — Progress Notes (Signed)
Called to room to assist during endoscopic procedure.  Patient ID and intended procedure confirmed with present staff. Received instructions for my participation in the procedure from the performing physician.  

## 2023-06-29 NOTE — Progress Notes (Signed)
Sedate, gd SR, tolerated procedure well, VSS, report to RN 

## 2023-06-29 NOTE — Progress Notes (Signed)
Gastroenterology History and Physical   Primary Care Physician:  Jeani Sow, MD   Reason for Procedure:   Dysphagia  Plan:    EGD with dilation     HPI: Jeremy Moran is a 37 y.o. male undergoing EGD to evaluate dysphagia, chest pain and recent transient food impaction.  He underwent an EGD in March 2023 to evaluate dysphagia in which reflux esophagitis was seen and esophageal biopsies showed increased eosinophils in the distal esophagus, but not proximally.  No esophageal stricture appreciated and dilation was not performed.  He denies typical GERD symptoms (heartburn, acid regurgitation).  He had been intermittently taking PPI, but has been taking omeprazole daily for the past several months.   Past Medical History:  Diagnosis Date   ADD (attention deficit disorder)    Allergy    GERD (gastroesophageal reflux disease)     Past Surgical History:  Procedure Laterality Date   UPPER GASTROINTESTINAL ENDOSCOPY      Prior to Admission medications   Medication Sig Start Date End Date Taking? Authorizing Provider  omeprazole (PRILOSEC) 10 MG capsule Take 10 mg by mouth as needed.   Yes [provider]  famotidine (PEPCID) 40 MG tablet Take 0.5 tablets (20 mg total) by mouth daily. Patient not taking: Reported on 06/29/2023 01/22/19   Janeece Agee, NP  fluticasone [facility]) 50 MCG/ACT nasal spray Place into both nostrils daily.    [provider]  ketoconazole (NIZORAL) 2 % cream Apply 1 Application topically 2 (two) times daily. 08/12/22   Jeani Sow, MD  ketoconazole (NIZORAL) 2 % shampoo Apply 1 Application topically 2 (two) times a week. 08/15/22   Jeani Sow, MD    Current Outpatient Medications  Medication Sig Dispense Refill   omeprazole (PRILOSEC) 10 MG capsule Take 10 mg by mouth as needed.     famotidine (PEPCID) 40 MG tablet Take 0.5 tablets (20 mg total) by mouth daily. (Patient not taking: Reported on 06/29/2023) 90 tablet 3    fluticasone (FLONASE) 50 MCG/ACT nasal spray Place into both nostrils daily.     ketoconazole (NIZORAL) 2 % cream Apply 1 Application topically 2 (two) times daily. 60 g 3   ketoconazole (NIZORAL) 2 % shampoo Apply 1 Application topically 2 (two) times a week. 120 mL 3   Current Facility-Administered Medications  Medication Dose Route Frequency Provider Last Rate Last Admin   0.9 %  sodium chloride infusion  500 mL Intravenous Continuous Jenel Lucks, MD        Allergies as of 06/29/2023   (No Known Allergies)    Family History  Problem Relation Age of Onset   Prostate cancer Father    Hypertension Father    Stroke Maternal Grandmother    Heart disease Maternal Grandmother    Hyperlipidemia Maternal Grandfather    Heart disease Maternal Grandfather    Heart disease Paternal Grandmother    Cancer Paternal Grandmother    Diabetes Paternal Grandfather    Hypertension Paternal Grandfather    Heart disease Paternal Grandfather    Colon cancer Neg Hx    Rectal cancer Neg Hx    Stomach cancer Neg Hx     Social History   Socioeconomic History   Marital status: Married    Spouse name: Not on file   Number of children: 1   Years of education: Not on file   Highest education level: Not on file  Occupational History   Not on file  Tobacco Use  Smoking status: Never   Smokeless tobacco: Never  Vaping Use   Vaping status: Never Used  Substance and Sexual Activity   Alcohol use: Yes    Comment: rarely   Drug use: No   Sexual activity: Yes  Other Topics Concern   Not on file  Social History Narrative   Daughter due 09/23/21 has 1 daughter   [employer]-medical devices-in or and icu freq.  pumps   Social Determinants of Health   Financial Resource Strain: Low Risk  (01/22/2019)   Overall Financial Resource Strain (CARDIA)    Difficulty of Paying Living Expenses: Not hard at all  Food Insecurity: No Food Insecurity (01/22/2019)   Hunger Vital Sign    Worried About Running  Out of Food in the Last Year: Never true    Ran Out of Food in the Last Year: Never true  Transportation Needs: No Transportation Needs (01/22/2019)   PRAPARE - Administrator, Civil Service (Medical): No    Lack of Transportation (Non-Medical): No  Physical Activity: Sufficiently Active (01/22/2019)   Exercise Vital Sign    Days of Exercise per Week: 5 days    Minutes of Exercise per Session: 60 min  Stress: No Stress Concern Present (01/22/2019)   [employer] of Occupational Health - Occupational Stress Questionnaire    Feeling of Stress : Only a little  Social Connections: Unknown (01/22/2019)   Social Connection and Isolation Panel [NHANES]    Frequency of Communication with Friends and Family: Three times a week    Frequency of Social Gatherings with Friends and Family: Twice a week    Attends Religious Services: Patient declined    Database administrator or Organizations: Patient declined    Attends Banker Meetings: Patient declined    Marital Status: Married  Catering manager Violence: Not At Risk (01/22/2019)   Humiliation, Afraid, Rape, and Kick questionnaire    Fear of Current or Ex-Partner: No    Emotionally Abused: No    Physically Abused: No    Sexually Abused: No    Review of Systems:  All other review of systems negative except as mentioned in the HPI.  Physical Exam: Vital signs BP (!) 142/61   Pulse 68   Temp 98 F (36.7 C)   Ht 6\' 3"  (1.905 m)   Wt 230 lb (104.3 kg)   SpO2 98%   BMI 28.75 kg/m   General:   Alert,  Well-developed, well-nourished, pleasant and cooperative in NAD Airway:  Mallampati 1 Lungs:  Clear throughout to auscultation.   Heart:  Regular rate and rhythm; no murmurs, clicks, rubs,  or gallops. Abdomen:  Soft, nontender and nondistended. Normal bowel sounds.   Neuro/Psych:  Normal mood and affect. A and O x 3   Grayton Lobo E. Tomasa Rand, MD [facility]

## 2023-06-29 NOTE — Op Note (Signed)
[facility] Patient Name: Jeremy Moran Procedure Date: 06/29/2023 9:51 AM MRN: [id number] Endoscopist: Lorin Picket E. Tomasa Rand , MD, [id number] Age: 37 Referring MD:  Date of Birth: 02/24/1986 Gender: Male Account #: [id number] Procedure:                Upper GI endoscopy Indications:              Dysphagia Medicines:                Monitored Anesthesia Care Procedure:                Pre-Anesthesia Assessment:                           - Prior to the procedure, a History and Physical                            was performed, and patient medications and                            allergies were reviewed. The patient's tolerance of                            previous anesthesia was also reviewed. The risks                            and benefits of the procedure and the sedation                            options and risks were discussed with the patient.                            All questions were answered, and informed consent                            was obtained. Prior Anticoagulants: The patient has                            taken no anticoagulant or antiplatelet agents. ASA                            Grade Assessment: I - A normal, healthy patient.                            After reviewing the risks and benefits, the patient                            was deemed in satisfactory condition to undergo the                            procedure.                           After obtaining informed consent, the endoscope was  passed under direct vision. Throughout the                            procedure, the patient's blood pressure, pulse, and                            oxygen saturations were monitored continuously. The                            Olympus Scope SN [id number] was introduced through the                            mouth, and advanced to the second part of duodenum.                            The upper GI endoscopy was accomplished  without                            difficulty. The patient tolerated the procedure                            well. Scope In: Scope Out: Findings:                 The examined portions of the nasopharynx,                            oropharynx and larynx were normal.                           One benign-appearing, intrinsic mild stenosis was                            found in the distal esophagus. This stenosis                            measured 1.4 cm (inner diameter) x less than one cm                            (in length). The stenosis was traversed. A                            guidewire was placed and the scope was withdrawn.                            Dilation was performed with a Savary dilator with                            no resistance at 17 mm and mild resistance at 19                            mm. The dilation site was examined following  endoscope reinsertion and showed moderate mucosal                            disruption following dilation with the 19 mm                            dilator (no rent present following dilation with 17                            mm). Estimated blood loss was minimal.                           The exam of the esophagus was otherwise normal.                           Biopsies were obtained from the proximal and distal                            esophagus with cold forceps for histology of                            suspected eosinophilic esophagitis. Estimated blood                            loss was minimal.                           The gastroesophageal flap valve was visualized                            endoscopically and classified as Hill Grade II                            (fold present, opens with respiration).                           The entire examined stomach was normal.                           The examined duodenum was normal. Complications:            No immediate complications. Estimated Blood  Loss:     Estimated blood loss was minimal. Impression:               - The examined portions of the nasopharynx,                            oropharynx and larynx were normal.                           - Benign-appearing esophageal stenosis. Dilated.                           - Gastroesophageal flap valve classified as Hill  Grade II (fold present, opens with respiration).                           - Normal stomach.                           - Normal examined duodenum.                           - Biopsies were taken with a cold forceps for                            evaluation of eosinophilic esophagitis. Recommendation:           - Patient has a contact number available for                            emergencies. The signs and symptoms of potential                            delayed complications were discussed with the                            patient. Return to normal activities tomorrow.                            Written discharge instructions were provided to the                            patient.                           - Full liquid diet today. Advance tomorrow if                            tolerating without pain.                           - Continue present medications.                           - Await pathology results. Makailyn Mccormick E. Tomasa Rand, MD 06/29/2023 10:16:17 AM This report has been signed electronically.

## 2023-06-30 ENCOUNTER — Telehealth: Payer: Self-pay | Admitting: *Deleted

## 2023-06-30 NOTE — Telephone Encounter (Signed)
  Follow up Call-     06/29/2023    9:24 AM 10/18/2021    8:50 AM  Call back number  Post procedure Call Back phone  # [phone]  Permission to leave phone message Yes Yes     Patient questions:  Do you have a fever, pain , or abdominal swelling? No. Pain Score  0 *  Have you tolerated food without any problems? Yes.    Have you been able to return to your normal activities? Yes.    Do you have any questions about your discharge instructions: Diet   No. Medications  No. Follow up visit  No.  Do you have questions or concerns about your Care? No.  Actions: * If pain score is 4 or above: No action needed, pain <4.

## 2023-07-03 LAB — SURGICAL PATHOLOGY

## 2023-07-04 NOTE — Progress Notes (Signed)
Jeremy Moran,  The biopsies of your esophagus were normal.  There is no evidence of increased eosinophils to suggest eosinophilic esophagitis. As we discussed, I think that you have underlying acid reflux which caused the stricture in your esophagus.  I do not think you have eosinophilic esophagitis. I would recommend you continue taking a proton pump inhibitor such as omeprazole on a daily basis.  If you continue to have difficulty swallowing, we can potentially schedule you for a repeat upper endoscopy and dilation. Please follow-up with me as needed in the office to discuss ongoing management of GERD.

## 2024-01-31 ENCOUNTER — Ambulatory Visit (INDEPENDENT_AMBULATORY_CARE_PROVIDER_SITE_OTHER): Admitting: Family Medicine

## 2024-01-31 ENCOUNTER — Encounter: Payer: Self-pay | Admitting: Family Medicine

## 2024-01-31 VITALS — BP 112/70 | HR 90 | Temp 98.1°F | Resp 18 | Ht 75.0 in | Wt 224.4 lb

## 2024-01-31 DIAGNOSIS — Z0000 Encounter for general adult medical examination without abnormal findings: Secondary | ICD-10-CM | POA: Diagnosis not present

## 2024-01-31 DIAGNOSIS — Z1159 Encounter for screening for other viral diseases: Secondary | ICD-10-CM

## 2024-01-31 NOTE — Patient Instructions (Signed)
 It was very nice to see you today!  Do labs   PLEASE NOTE:  If you had any lab tests please let us  know if you have not heard back within a few days. You may see your results on MyChart before we have a chance to review them but we will give you a call once they are reviewed by us . If we ordered any referrals today, please let us  know if you have not heard from their office within the next week.   Please try these tips to maintain a healthy lifestyle:  Eat most of your calories during the day when you are active. Eliminate processed foods including packaged sweets (pies, cakes, cookies), reduce intake of potatoes, white bread, white pasta, and white rice. Look for whole grain options, oat flour or almond flour.  Each meal should contain half fruits/vegetables, one quarter protein, and one quarter carbs (no bigger than a computer mouse).  Cut down on sweet beverages. This includes juice, soda, and sweet tea. Also watch fruit intake, though this is a healthier sweet option, it still contains natural sugar! Limit to 3 servings daily.  Drink at least 1 glass of water with each meal and aim for at least 8 glasses per day  Exercise at least 150 minutes every week.

## 2024-01-31 NOTE — Progress Notes (Signed)
 Phone: [phone]   Subjective:  Patient 38 y.o. male presenting for annual physical.  Chief Complaint  Patient presents with   Annual Exam    CPE Not fasting   Annual-not exercising as much(wts/crossfit) but still walking, jogging Discussed the use of AI scribe software for clinical note transcription with the patient, who gave verbal consent to proceed.  History of Present Illness Yadriel Kerrigan is a 38 year old male who presents for a routine follow-up.  He underwent an esophageal dilation last year, which has significantly improved his symptoms of choking. However, he experiences burning sensations due to stomach acid reflux, particularly when lying flat during sleep. He manages this by elevating the head of his bed and sleeping on his side. He continues to take Pepcid  or omeprazole as needed for reflux symptoms.  He experiences occasional 'pinches' in his chest, particularly during work-related activities, but not during physical activities such as running or walking. No heart racing, skipping beats, or other cardiac symptoms. There is no family history of coronary artery disease.(Works in cardiac unit)  He has experienced gastrointestinal discomfort, including cramping, which he attributes to a trial of semaglutide for weight management. He discontinued the medication after experiencing severe cramps. He also reports occasional gastrointestinal upset after consuming sushi, and he believes he may have lactose intolerance.  He is not currently engaging in regular exercise due to time constraints but maintains some level of physical activity by walking, jogging, and using a Peloton bike. His heart rate increases to 150-160 bpm during runs, which he finds concerning. He has gained weight since his last visit but has lost most of it, now weighing around 220 pounds.  No major headaches, dizziness, vision changes, sore throat, hoarseness, or swallowing difficulties. No respiratory  symptoms such as coughing, wheezing, or shortness of breath. No issues with urination or erectile function and no depressive symptoms or suicidal thoughts.    See problem oriented charting- ROS- ROS: Gen: no fever, chills  Skin: no rash, itching ENT: no ear pain, ear drainage, nasal congestion, rhinorrhea, sinus pressure, sore throat Eyes: no blurry vision, double vision Resp: no cough, wheeze,SOB CV: no palpitations, LE edema,  GI: no , n/v/d/c, abd pain GU: no dysuria, urgency, frequency, hematuria MSK: some Neuro: no dizziness, headache, weakness, vertigo Psych: no depression, anxiety, insomnia, SI   The following were reviewed and entered/updated in epic: Past Medical History:  Diagnosis Date   ADD (attention deficit disorder)    Allergy    GERD (gastroesophageal reflux disease)    Patient Active Problem List   Diagnosis Date Noted   Attention deficit disorder 04/19/2012   Past Surgical History:  Procedure Laterality Date   UPPER GASTROINTESTINAL ENDOSCOPY      Family History  Problem Relation Age of Onset   Prostate cancer Father    Hypertension Father    Stroke Maternal Grandmother    Heart disease Maternal Grandmother    Hyperlipidemia Maternal Grandfather    Heart disease Maternal Grandfather    Heart disease Paternal Grandmother    Cancer Paternal Grandmother    Diabetes Paternal Grandfather    Hypertension Paternal Grandfather    Heart disease Paternal Grandfather    Colon cancer Neg Hx    Rectal cancer Neg Hx    Stomach cancer Neg Hx     Medications- reviewed and updated Current Outpatient Medications  Medication Sig Dispense Refill   famotidine  (PEPCID ) 40 MG tablet Take 0.5 tablets (20 mg total) by mouth daily. 90 tablet  3   fluticasone (FLONASE) 50 MCG/ACT nasal spray Place into both nostrils daily. (Patient taking differently: Place into both nostrils as needed.)     ketoconazole  (NIZORAL ) 2 % cream Apply 1 Application topically 2 (two) times  daily. 60 g 3   ketoconazole  (NIZORAL ) 2 % shampoo Apply 1 Application topically 2 (two) times a week. 120 mL 3   omeprazole (PRILOSEC) 10 MG capsule Take 10 mg by mouth as needed.     No current facility-administered medications for this visit.    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 DP 2+B LUNGS: CTAB. No wheezes ABDOMEN:  BS+, soft, NTND, No HSM, no masses EXT:  no edema MSK: no gross abnormalities. MS 5/5 all 4 NEURO: A&O x3.  CN II-XII intact.  PSYCH: normal mood. Good eye contact     Assessment and Plan   Health Maintenance counseling: 1. Anticipatory guidance: Patient counseled regarding regular dental exams q6 months, eye exams yearly, avoiding smoking and second hand smoke, limiting alcohol to 2 beverages per day.   2. Risk factor reduction:  Advised patient of need for regular exercise and diet rich in fruits and vegetables to reduce risk of heart attack and stroke. Exercise- +.   Wt Readings from Last 3 Encounters:  01/31/24 224 lb 6 oz (101.8 kg)  06/29/23 230 lb (104.3 kg)  05/29/23 230 lb 8 oz (104.6 kg)   3. Immunizations/screenings/ancillary studies Immunization History  Administered Date(s) Administered   Dtap, Unspecified 07/29/1985, 09/30/1985, 04/30/1987, 03/26/1991   Hep A, Unspecified 05/29/2003, 02/12/2004   Hep B, Unspecified 04/28/1997, 06/02/1997, 11/13/1997   Hepatitis B 04/18/2012, 08/22/2012   Influenza Inj Mdck Quad Pf 05/09/2022    Influenza, Mdck, Trivalent,PF 6+ MOS(egg free) 05/13/2023   Influenza,inj,Quad PF,6+ Mos 05/11/2017, 05/18/2021, 05/08/2022   Influenza-Unspecified 05/25/2016, 05/11/2017, 04/11/2018, 05/14/2019   MMR 03/26/1991   Meningococcal Acwy, Unspecified 05/29/2003   PFIZER(Purple Top)SARS-COV-2 Vaccination 07/20/2019, 08/09/2019, 07/20/2020   PPD Test 11/26/2014   Pfizer(Comirnaty)Fall Seasonal Vaccine 12 years and older 05/13/2023   Polio, Unspecified 07/29/1985, 09/30/1985, 04/30/1987, 04/29/1997   Td 03/12/1996, 08/12/2002   Tdap 11/26/2014   Health Maintenance Due  Topic Date Due   Hepatitis C Screening  Never done   Hepatitis B Vaccines (3 of 3 - 19+ 3-dose series) 10/17/2012    4. Skin cancer screening- Iadvised regular sunscreen use. Denies worrisome, changing, or new skin lesions.  5. Smoking associated screening: non smoker   Wellness examination -     CBC with Differential/Platelet -     Comprehensive metabolic panel with GFR -     Lipid panel -     TSH -     Hemoglobin A1c -     Hepatitis C antibody  Screening for viral disease -     Hepatitis C antibody   Wellness-anticipatory guidance.  Work on Diet/Exercise  Check CBC,CMP,lipids,TSH, A1C.  F/u 1 yr Assessment and Plan Assessment & Plan Gastroesophageal Reflux Disease (GERD)   Chronic GERD with recent esophageal dilation shows improvement in choking symptoms. He experiences nocturnal acid reflux with a burning sensation when lying flat, worsened by esophageal dilation. He uses Pepcid  or omeprazole intermittently. Elevate the head of the bed during sleep and continue Pepcid  or omeprazole as needed.  Dietary Habits and Weight Management   Weight gain is attributed to frequent dining out and consuming rich foods due to his job. He previously used semaglutide for weight management but stopped due to severe gastrointestinal cramping. He prefers to increase exercise for weight management. Increase exercise and consider dietary  modifications.  Physical Inactivity   Physical activity has decreased due to time constraints and soreness. He engages in walking, jogging, and using Peloton but not at previous fitness levels. He is encouraged to prioritize exercise despite a busy schedule. Incorporate more physical activity into the daily routine, such as walking during lunch breaks.  Seborrheic Dermatitis   Seborrheic dermatitis is managed with ketoconazole  shampoo. He is currently using tar or charcoal shampoo with satisfactory results. Continue the current shampoo regimen.  General Health Maintenance   The importance of staying active, eating right, avoiding drugs and alcohol, using sunscreen, and regular dental visits was discussed. He plans to return for fasting labs. Hepatitis C screening was discussed due to potential risk factors. Schedule fasting blood work and perform Hepatitis C screening as per guidelines.     Recommended follow up: Return in about 1 year (around 01/30/2025) for annual physical.  Lab/Order associations:return fasting  Jenkins CHRISTELLA Carrel, MD

## 2024-02-02 ENCOUNTER — Other Ambulatory Visit

## 2024-04-16 ENCOUNTER — Telehealth: Payer: Self-pay | Admitting: *Deleted

## 2024-04-16 NOTE — Telephone Encounter (Signed)
 Copied from CRM [id number]. Topic: Clinical - Request for Lab/Test Order >> Apr 16, 2024  1:54 PM Rea C wrote: Reason for CRM: Patient is getting labs done this Thursday and would like to Dr. 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

## 2024-04-17 ENCOUNTER — Other Ambulatory Visit: Payer: Self-pay | Admitting: *Deleted

## 2024-04-17 DIAGNOSIS — Z111 Encounter for screening for respiratory tuberculosis: Secondary | ICD-10-CM

## 2024-04-17 NOTE — Telephone Encounter (Signed)
 Lab order placed.

## 2024-04-18 ENCOUNTER — Other Ambulatory Visit (INDEPENDENT_AMBULATORY_CARE_PROVIDER_SITE_OTHER)

## 2024-04-18 DIAGNOSIS — Z111 Encounter for screening for respiratory tuberculosis: Secondary | ICD-10-CM

## 2024-04-18 DIAGNOSIS — Z0000 Encounter for general adult medical examination without abnormal findings: Secondary | ICD-10-CM

## 2024-04-18 DIAGNOSIS — Z1159 Encounter for screening for other viral diseases: Secondary | ICD-10-CM

## 2024-04-18 LAB — COMPREHENSIVE METABOLIC PANEL WITH GFR
ALT: 32 U/L (ref 0–53)
AST: 19 U/L (ref 0–37)
Albumin: 4.7 g/dL (ref 3.5–5.2)
Alkaline Phosphatase: 63 U/L (ref 39–117)
BUN: 21 mg/dL (ref 6–23)
CO2: 28 meq/L (ref 19–32)
Calcium: 9.7 mg/dL (ref 8.4–10.5)
Chloride: 105 meq/L (ref 96–112)
Creatinine, Ser: 1.08 mg/dL (ref 0.40–1.50)
GFR: 86.84 mL/min (ref >60.00)
Glucose, Bld: 102 mg/dL — ABNORMAL HIGH (ref 70–99)
Potassium: 4.2 meq/L (ref 3.5–5.1)
Sodium: 141 meq/L (ref 135–145)
Total Bilirubin: 0.6 mg/dL (ref 0.2–1.2)
Total Protein: 6.8 g/dL (ref 6.0–8.3)

## 2024-04-18 LAB — LIPID PANEL
Cholesterol: 179 mg/dL (ref 0–200)
HDL: 42.3 mg/dL (ref >39.00)
LDL Cholesterol: 112 mg/dL — ABNORMAL HIGH (ref 0–99)
NonHDL: 136.31
Total CHOL/HDL Ratio: 4
Triglycerides: 123 mg/dL (ref 0.0–149.0)
VLDL: 24.6 mg/dL (ref 0.0–40.0)

## 2024-04-18 LAB — CBC WITH DIFFERENTIAL/PLATELET
Basophils Absolute: 0 K/uL (ref 0.0–0.1)
Basophils Relative: 0.4 % (ref 0.0–3.0)
Eosinophils Absolute: 0.1 K/uL (ref 0.0–0.7)
Eosinophils Relative: 2.5 % (ref 0.0–5.0)
HCT: 44.4 % (ref 39.0–52.0)
Hemoglobin: 15.5 g/dL (ref 13.0–17.0)
Lymphocytes Relative: 34.1 % (ref 12.0–46.0)
Lymphs Abs: 1.8 K/uL (ref 0.7–4.0)
MCHC: 34.8 g/dL (ref 30.0–36.0)
MCV: 88.6 fl (ref 78.0–100.0)
Monocytes Absolute: 0.5 K/uL (ref 0.1–1.0)
Monocytes Relative: 10.6 % (ref 3.0–12.0)
Neutro Abs: 2.7 K/uL (ref 1.4–7.7)
Neutrophils Relative %: 52.4 % (ref 43.0–77.0)
Platelets: 142 K/uL — ABNORMAL LOW (ref 150.0–400.0)
RBC: 5.01 Mil/uL (ref 4.22–5.81)
RDW: 13.2 % (ref 11.5–15.5)
WBC: 5.2 K/uL (ref 4.0–10.5)

## 2024-04-18 LAB — HEMOGLOBIN A1C: Hgb A1c MFr Bld: 5.5 % (ref 4.6–6.5)

## 2024-04-18 LAB — TSH: TSH: 2.77 u[IU]/mL (ref 0.35–5.50)

## 2024-04-18 NOTE — Addendum Note (Signed)
 Addended by: JOSHUA JOEN RAMAN on: 04/18/2024 09:47 AM   Modules accepted: Orders

## 2024-04-19 LAB — HEPATITIS C ANTIBODY: Hepatitis C Ab: NONREACTIVE

## 2024-04-21 ENCOUNTER — Ambulatory Visit: Payer: Self-pay | Admitting: Family Medicine

## 2024-04-21 NOTE — Progress Notes (Signed)
 Labs acceptable.  Sugar slightly elevated-if fasting-work on diet/exercise

## 2024-04-22 LAB — QUANTIFERON-TB GOLD PLUS
Mitogen-NIL: 7.77 [IU]/mL
NIL: 0.03 [IU]/mL
QuantiFERON-TB Gold Plus: NEGATIVE
TB1-NIL: 0 [IU]/mL
TB2-NIL: 0 [IU]/mL

## 2024-05-01 ENCOUNTER — Ambulatory Visit: Admitting: Family Medicine

## 2025-01-31 ENCOUNTER — Encounter: Admitting: Family Medicine
